# Patient Record
Sex: Female | Born: 1995 | Race: Black or African American | Hispanic: No | Marital: Single | Smoking: Current every day smoker
Health system: Southern US, Community
[De-identification: ages and names within clinical notes are randomized; demographics above are authoritative.]

## PROBLEM LIST (undated history)

## (undated) DIAGNOSIS — J45909 Unspecified asthma, uncomplicated: Secondary | ICD-10-CM

## (undated) DIAGNOSIS — F32A Depression, unspecified: Secondary | ICD-10-CM

## (undated) DIAGNOSIS — M1990 Unspecified osteoarthritis, unspecified site: Secondary | ICD-10-CM

## (undated) DIAGNOSIS — I10 Essential (primary) hypertension: Secondary | ICD-10-CM

## (undated) DIAGNOSIS — F419 Anxiety disorder, unspecified: Secondary | ICD-10-CM

## (undated) DIAGNOSIS — R519 Headache, unspecified: Secondary | ICD-10-CM

---
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
## 2019-12-25 ENCOUNTER — Emergency Department (HOSPITAL_COMMUNITY)
Admission: EM | Admit: 2019-12-25 | Discharge: 2019-12-26 | Disposition: A | Discharge status: Home / Self Care | Payer: Self-pay | Attending: Emergency Medicine | Admitting: Emergency Medicine

## 2019-12-25 ENCOUNTER — Encounter (HOSPITAL_COMMUNITY): Payer: Self-pay

## 2019-12-25 DIAGNOSIS — L509 Urticaria, unspecified: Secondary | ICD-10-CM | POA: Insufficient documentation

## 2019-12-25 NOTE — ED Triage Notes (Signed)
Pt arrives to ED w/ c/o generalized rash x 3 days. Pt has been using benadryl which she states has not helped.

## 2019-12-26 MED ORDER — FAMOTIDINE 20 MG PO TABS: 20.0000 mg | ORAL_TABLET | Freq: Every day | ORAL | 0 refills | Status: DC

## 2019-12-26 MED ORDER — EPINEPHRINE 0.3 MG/0.3ML IJ SOAJ: 0.3000 mg | INTRAMUSCULAR | 0 refills | Status: AC | PRN

## 2019-12-26 MED ORDER — FAMOTIDINE 20 MG PO TABS
40.0000 mg | ORAL_TABLET | Freq: Once | ORAL | Status: AC
  Administered 2019-12-26: 40 mg via ORAL
  Filled 2019-12-26: qty 2

## 2019-12-26 NOTE — ED Provider Notes (Signed)
[facility] Provider Note  CSN: [id number] Arrival date & time: 12/25/19 2213  Chief Complaint(s) Rash  HPI Mary Molina is a 24 y.o. female   The history is provided by the patient.  Rash Quality: itchiness and redness   Severity:  Moderate Onset quality:  Gradual Duration:  3 days Timing:  Constant Progression since onset: migrating. Chronicity:  New Context: medications (started Flagyl last week)   Relieved by:  Antihistamines Worsened by:  Nothing Associated symptoms: no fever, no headaches, no nausea, no throat swelling, no tongue swelling, not vomiting and not wheezing    Patient reports that the rash began in her lower extremities then moved to her breast and now to her bilateral upper extremities.  The rash migrates every day.  It has disappeared from the legs and the breast after taking Benadryl.  Urticaria in bilateral upper extremities has improved since taking her urticaria but has not gone away.  Past Medical History History reviewed. No pertinent past medical history. There are no problems to display for this patient.  Home Medication(s) Prior to Admission medications   Medication Sig Start Date End Date Taking? Authorizing Provider  EPINEPHrine 0.3 mg/0.3 mL IJ SOAJ injection Inject 0.3 mLs (0.3 mg total) into the muscle as needed for anaphylaxis. 12/26/19   Nira Conn, MD  famotidine (PEPCID) 20 MG tablet Take 1 tablet (20 mg total) by mouth daily for 5 days. 12/26/19 12/31/19  Nira Conn, MD                                                                                                                                    Past Surgical History History reviewed. No pertinent surgical history. Family History No family history on file.  Social History Social History   Tobacco Use  . Smoking status: Not on file  Substance Use Topics  . Alcohol use: Not on file  . Drug use: Not on file    Allergies Patient has no allergy information on record.  Review of Systems Review of Systems  Constitutional: Negative for fever.  Respiratory: Negative for wheezing.   Gastrointestinal: Negative for nausea and vomiting.  Skin: Positive for rash.  Neurological: Negative for headaches.   All other systems are reviewed and are negative for acute change except as noted in the HPI  Physical Exam Vital Signs  I have reviewed the triage vital signs BP (!) 130/84 (BP Location: Left Arm)   Pulse 105   Temp 98.4 F (36.9 C) (Oral)   Resp 18   SpO2 96%   Physical Exam Vitals reviewed.  Constitutional:      General: She is not in acute distress.    Appearance: She is well-developed. She is not diaphoretic.  HENT:     Head: Normocephalic and atraumatic.     Nose: Nose normal.  Eyes:     General: No scleral icterus.  Right eye: No discharge.        Left eye: No discharge.     Conjunctiva/sclera: Conjunctivae normal.     Pupils: Pupils are equal, round, and reactive to light.  Cardiovascular:     Rate and Rhythm: Normal rate and regular rhythm.     Heart sounds: No murmur heard.  No friction rub. No gallop.   Pulmonary:     Effort: Pulmonary effort is normal. No respiratory distress.     Breath sounds: Normal breath sounds. No stridor. No rales.  Abdominal:     General: There is no distension.     Palpations: Abdomen is soft.     Tenderness: There is no abdominal tenderness.  Musculoskeletal:        General: No tenderness.     Cervical back: Normal range of motion and neck supple.  Skin:    General: Skin is warm and dry.     Findings: Rash present. No erythema. Rash is urticarial (to BUE).  Neurological:     Mental Status: She is alert and oriented to person, place, and time.     ED Results and Treatments Labs (all labs ordered are listed, but only abnormal results are displayed) Labs Reviewed - No data to display                                                                                                                        EKG  EKG Interpretation  Date/Time:    Ventricular Rate:    PR Interval:    QRS Duration:   QT Interval:    QTC Calculation:   R Axis:     Text Interpretation:        Radiology No results found.  Pertinent labs & imaging results that were available during my care of the patient were reviewed by me and considered in my medical decision making (see chart for details).  Medications Ordered in ED Medications  famotidine (PEPCID) tablet 40 mg (has no administration in time range)                                                                                                                                    Procedures Procedures  (including critical care time)  Medical Decision Making / ED Course I have reviewed the nursing notes for this encounter and the patient's prior records (if available in EHR or on provided  paperwork).   [facility] was evaluated in Emergency Department on 12/26/2019 for the symptoms described in the history of present illness. She was evaluated in the context of the global COVID-19 pandemic, which necessitated consideration that the patient might be at risk for infection with the SARS-CoV-2 virus that causes COVID-19. Institutional protocols and algorithms that pertain to the evaluation of patients at risk for COVID-19 are in a state of rapid change based on information released by regulatory bodies including the CDC and federal and state organizations. These policies and algorithms were followed during the patient's care in the ED.  AFVSS. Urticaria to BUE No respiratory, GI, or neurologic symptoms to suggest anaphylaxis. No recent infectious symptoms suggestive of viral urticaria.  Patient has taken benadryl prior to arrival.   On exam, there is no evidence of oral swelling or airway compromise.   Given H2 blocker.   Safe for discharge with strict return precautions. Given Rx for H2  blocker and epi pen.      Final Clinical Impression(s) / ED Diagnoses Final diagnoses:  Hives   The patient appears reasonably screened and/or stabilized for discharge and I doubt any other medical condition or other [facility] requiring further screening, evaluation, or treatment in the ED at this time prior to discharge. Safe for discharge with strict return precautions.  Disposition: Discharge  Condition: Good  I have discussed the results, Dx and Tx plan with the patient/family who expressed understanding and agree(s) with the plan. Discharge instructions discussed at length. The patient/family was given strict return precautions who verbalized understanding of the instructions. No further questions at time of discharge.    ED Discharge Orders         Ordered    famotidine (PEPCID) 20 MG tablet  Daily     Discontinue  Reprint     12/26/19 0116    EPINEPHrine 0.3 mg/0.3 mL IJ SOAJ injection  As needed     Discontinue  Reprint     12/26/19 0116            Follow Up: Primary care provider  Schedule an appointment as soon as possible for a visit        This chart was dictated using voice recognition software.  Despite best efforts to proofread,  errors can occur which can change the documentation meaning.   Nira Conn, MD 12/26/19 [phone]

## 2019-12-26 NOTE — ED Notes (Signed)
All discharge instructions and medications discussed with pt. Pt verbalizes understanding. Discharged without issue.

## 2020-02-18 ENCOUNTER — Emergency Department (HOSPITAL_COMMUNITY)
Admission: EM | Admit: 2020-02-18 | Discharge: 2020-02-19 | Disposition: A | Discharge status: Home / Self Care | Payer: Medicaid Other | Attending: Emergency Medicine | Admitting: Emergency Medicine

## 2020-02-18 ENCOUNTER — Encounter (HOSPITAL_COMMUNITY): Payer: Self-pay

## 2020-02-18 DIAGNOSIS — Z5321 Procedure and treatment not carried out due to patient leaving prior to being seen by health care provider: Secondary | ICD-10-CM | POA: Insufficient documentation

## 2020-02-18 DIAGNOSIS — R197 Diarrhea, unspecified: Secondary | ICD-10-CM | POA: Insufficient documentation

## 2020-02-18 DIAGNOSIS — J45909 Unspecified asthma, uncomplicated: Secondary | ICD-10-CM | POA: Insufficient documentation

## 2020-02-18 DIAGNOSIS — R21 Rash and other nonspecific skin eruption: Secondary | ICD-10-CM | POA: Insufficient documentation

## 2020-02-18 HISTORY — DX: Essential (primary) hypertension: I10

## 2020-02-18 HISTORY — DX: Unspecified asthma, uncomplicated: J45.909

## 2020-02-18 LAB — CBC
HCT: 40.6 % (ref 36.0–46.0)
Hemoglobin: 13.1 g/dL (ref 12.0–15.0)
MCH: 29.1 pg (ref 26.0–34.0)
MCHC: 32.3 g/dL (ref 30.0–36.0)
MCV: 90.2 fL (ref 80.0–100.0)
Platelets: 289 10*3/uL (ref 150–400)
RBC: 4.5 MIL/uL (ref 3.87–5.11)
RDW: 12.6 % (ref 11.5–15.5)
WBC: 10 10*3/uL (ref 4.0–10.5)
nRBC: 0 % (ref 0.0–0.2)

## 2020-02-18 LAB — I-STAT BETA HCG BLOOD, ED (MC, WL, AP ONLY): I-stat hCG, quantitative: <5 m[IU]/mL (ref <5)

## 2020-02-18 LAB — COMPREHENSIVE METABOLIC PANEL
ALT: 15 U/L (ref 0–44)
AST: 17 U/L (ref 15–41)
Albumin: 4 g/dL (ref 3.5–5.0)
Alkaline Phosphatase: 65 U/L (ref 38–126)
Anion gap: 9 (ref 5–15)
BUN: 8 mg/dL (ref 6–20)
CO2: 25 mmol/L (ref 22–32)
Calcium: 9.2 mg/dL (ref 8.9–10.3)
Chloride: 104 mmol/L (ref 98–111)
Creatinine, Ser: 0.92 mg/dL (ref 0.44–1.00)
GFR calc Af Amer: >60 mL/min (ref >60)
GFR calc non Af Amer: >60 mL/min (ref >60)
Glucose, Bld: 121 mg/dL — ABNORMAL HIGH (ref 70–99)
Potassium: 3.2 mmol/L — ABNORMAL LOW (ref 3.5–5.1)
Sodium: 138 mmol/L (ref 135–145)
Total Bilirubin: 0.4 mg/dL (ref 0.3–1.2)
Total Protein: 7 g/dL (ref 6.5–8.1)

## 2020-02-18 LAB — LIPASE, BLOOD: Lipase: 31 U/L (ref 11–51)

## 2020-02-18 NOTE — ED Triage Notes (Signed)
Pt reports that she broke out in hives the other day and again today as well as diarrhea for the past 3 days

## 2020-02-19 LAB — URINALYSIS, ROUTINE W REFLEX MICROSCOPIC
Bilirubin Urine: NEGATIVE
Glucose, UA: NEGATIVE mg/dL
Hgb urine dipstick: NEGATIVE
Ketones, ur: NEGATIVE mg/dL
Leukocytes,Ua: NEGATIVE
Nitrite: NEGATIVE
Protein, ur: 30 mg/dL — AB
Specific Gravity, Urine: 1.032 — ABNORMAL HIGH (ref 1.005–1.030)
pH: 6 (ref 5.0–8.0)

## 2020-02-19 NOTE — ED Notes (Signed)
Pt stated she is leaving. 

## 2020-04-11 ENCOUNTER — Emergency Department (HOSPITAL_COMMUNITY): Payer: HRSA Program

## 2020-04-11 ENCOUNTER — Encounter (HOSPITAL_COMMUNITY): Payer: Self-pay | Admitting: Emergency Medicine

## 2020-04-11 ENCOUNTER — Other Ambulatory Visit: Payer: Self-pay

## 2020-04-11 ENCOUNTER — Emergency Department (HOSPITAL_COMMUNITY)
Admission: EM | Admit: 2020-04-11 | Discharge: 2020-04-11 | Disposition: A | Discharge status: Home / Self Care | Payer: HRSA Program | Attending: Emergency Medicine | Admitting: Emergency Medicine

## 2020-04-11 DIAGNOSIS — B349 Viral infection, unspecified: Secondary | ICD-10-CM | POA: Diagnosis not present

## 2020-04-11 DIAGNOSIS — U071 COVID-19: Secondary | ICD-10-CM | POA: Diagnosis not present

## 2020-04-11 DIAGNOSIS — J45909 Unspecified asthma, uncomplicated: Secondary | ICD-10-CM | POA: Insufficient documentation

## 2020-04-11 DIAGNOSIS — I10 Essential (primary) hypertension: Secondary | ICD-10-CM | POA: Insufficient documentation

## 2020-04-11 DIAGNOSIS — R059 Cough, unspecified: Secondary | ICD-10-CM | POA: Diagnosis present

## 2020-04-11 LAB — CBC
HCT: 42.8 % (ref 36.0–46.0)
Hemoglobin: 13.8 g/dL (ref 12.0–15.0)
MCH: 29.5 pg (ref 26.0–34.0)
MCHC: 32.2 g/dL (ref 30.0–36.0)
MCV: 91.5 fL (ref 80.0–100.0)
Platelets: 237 10*3/uL (ref 150–400)
RBC: 4.68 MIL/uL (ref 3.87–5.11)
RDW: 12.3 % (ref 11.5–15.5)
WBC: 7.1 10*3/uL (ref 4.0–10.5)
nRBC: 0 % (ref 0.0–0.2)

## 2020-04-11 LAB — TROPONIN I (HIGH SENSITIVITY)
Troponin I (High Sensitivity): <2 ng/L (ref <18)
Troponin I (High Sensitivity): <2 ng/L (ref <18)

## 2020-04-11 LAB — RESPIRATORY PANEL BY RT PCR (FLU A&B, COVID)
Influenza A by PCR: NEGATIVE
Influenza B by PCR: NEGATIVE
SARS Coronavirus 2 by RT PCR: POSITIVE — AB

## 2020-04-11 LAB — URINALYSIS, ROUTINE W REFLEX MICROSCOPIC
Bilirubin Urine: NEGATIVE
Glucose, UA: NEGATIVE mg/dL
Ketones, ur: NEGATIVE mg/dL
Nitrite: NEGATIVE
Protein, ur: NEGATIVE mg/dL
Specific Gravity, Urine: 1.012 (ref 1.005–1.030)
pH: 6 (ref 5.0–8.0)

## 2020-04-11 LAB — BASIC METABOLIC PANEL
Anion gap: 10 (ref 5–15)
BUN: 5 mg/dL — ABNORMAL LOW (ref 6–20)
CO2: 20 mmol/L — ABNORMAL LOW (ref 22–32)
Calcium: 9.1 mg/dL (ref 8.9–10.3)
Chloride: 107 mmol/L (ref 98–111)
Creatinine, Ser: 1.04 mg/dL — ABNORMAL HIGH (ref 0.44–1.00)
GFR, Estimated: >60 mL/min (ref >60)
Glucose, Bld: 118 mg/dL — ABNORMAL HIGH (ref 70–99)
Potassium: 4 mmol/L (ref 3.5–5.1)
Sodium: 137 mmol/L (ref 135–145)

## 2020-04-11 LAB — I-STAT BETA HCG BLOOD, ED (MC, WL, AP ONLY): I-stat hCG, quantitative: <5 m[IU]/mL (ref <5)

## 2020-04-11 MED ORDER — ONDANSETRON 4 MG PO TBDP
4.0000 mg | ORAL_TABLET | Freq: Three times a day (TID) | ORAL | 0 refills | Status: DC | PRN
Start: 2020-04-11 — End: 2021-02-15

## 2020-04-11 MED ORDER — KETOROLAC TROMETHAMINE 30 MG/ML IJ SOLN
15.0000 mg | Freq: Once | INTRAMUSCULAR | Status: AC
  Administered 2020-04-11: 15 mg via INTRAVENOUS
  Filled 2020-04-11: qty 1

## 2020-04-11 MED ORDER — BENZONATATE 100 MG PO CAPS
100.0000 mg | ORAL_CAPSULE | Freq: Once | ORAL | Status: AC
  Administered 2020-04-11: 100 mg via ORAL
  Filled 2020-04-11: qty 1

## 2020-04-11 MED ORDER — BENZONATATE 100 MG PO CAPS
100.0000 mg | ORAL_CAPSULE | Freq: Three times a day (TID) | ORAL | 0 refills | Status: DC
Start: 2020-04-11 — End: 2021-07-06

## 2020-04-11 MED ORDER — ACETAMINOPHEN 500 MG PO TABS
1000.0000 mg | ORAL_TABLET | Freq: Once | ORAL | Status: DC
  Filled 2020-04-11: qty 2

## 2020-04-11 MED ORDER — SODIUM CHLORIDE 0.9 % IV BOLUS
1000.0000 mL | Freq: Once | INTRAVENOUS | Status: AC
  Administered 2020-04-11: 1000 mL via INTRAVENOUS

## 2020-04-11 NOTE — Discharge Instructions (Signed)
Take tylenol 2 pills 4 times a day and motrin 4 pills 3 times a day.  Drink plenty of fluids.  Return for worsening shortness of breath, headache, confusion. Follow up with your family doctor.   

## 2020-04-11 NOTE — ED Triage Notes (Signed)
Pt coming from home. Complaint of chest pain, abdominal pain and shortness of breath since Monday. Pt seen at [facility] and negative for covid and flu. NAD.

## 2020-04-11 NOTE — ED Provider Notes (Signed)
[facility] EMERGENCY DEPARTMENT Provider Note   CSN: [id number] Arrival date & time: 04/11/20  1637     History Chief Complaint  Patient presents with  . Abdominal Pain  . Chest Pain  . Shortness of Breath    Mary Molina is a 24 y.o. female.  24 yo F with a chief complaints of cough congestion fevers chills myalgias sore throat abdominal pain and chest pain.  Going on for about a week now.  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  Some were diagnosed with the flu and some are diagnosed with strep pharyngitis.  Yesterday she went to an urgent care center and had a Covid strep and flu test that were negative.  She was still having symptoms today and so came to the ED for evaluation.  The abdominal pain is diffuse and seems to come and go.  Chest pain is substernal and is worse with coughing and deep breathing twisting turning.  The history is provided by the patient.  Abdominal Pain Associated symptoms: chest pain, cough, shortness of breath and sore throat   Associated symptoms: no chills, no dysuria, no fever, no nausea and no vomiting   Chest Pain Associated symptoms: abdominal pain, cough and shortness of breath   Associated symptoms: no dizziness, no fever, no headache, no nausea, no palpitations and no vomiting   Shortness of Breath Associated symptoms: abdominal pain, chest pain, cough and sore throat   Associated symptoms: no fever, no headaches, no vomiting and no wheezing   Illness Severity:  Moderate Onset quality:  Gradual Duration:  1 week Timing:  Constant Progression:  Worsening Chronicity:  New Associated symptoms: abdominal pain, chest pain, congestion, cough, shortness of breath and sore throat   Associated symptoms: no fever, no headaches, no myalgias, no nausea, no rhinorrhea, no vomiting and no wheezing        Past Medical History:  Diagnosis Date  . Asthma   . Hypertension     There  are no problems to display for this patient.   History reviewed. No pertinent surgical history.   OB History   No obstetric history on file.     No family history on file.  Social History   Tobacco Use  . Smoking status: Not on file  Substance Use Topics  . Alcohol use: Not on file  . Drug use: Not on file    Home Medications Prior to Admission medications   Medication Sig Start Date End Date Taking? Authorizing Provider  albuterol (PROVENTIL) (2.5 MG/3ML) 0.083% nebulizer solution Take 2.5 mg by nebulization daily as needed.  11/10/19  Yes [provider]  baclofen (LIORESAL) 10 MG tablet Take 10 mg by mouth 3 (three) times daily. 12/18/19  Yes [provider]  EPINEPHrine 0.3 mg/0.3 mL IJ SOAJ injection Inject 0.3 mLs (0.3 mg total) into the muscle as needed for anaphylaxis. 12/26/19  Yes Cardama, Amadeo Garnet, MD  benzonatate (TESSALON) 100 MG capsule Take 1 capsule (100 mg total) by mouth every 8 (eight) hours. 04/11/20   Melene Plan, DO  famotidine (PEPCID) 20 MG tablet Take 1 tablet (20 mg total) by mouth daily for 5 days. Patient not taking: Reported on 04/11/2020 12/26/19 12/31/19  Nira Conn, MD  ondansetron (ZOFRAN ODT) 4 MG disintegrating tablet Take 1 tablet (4 mg total) by mouth every 8 (eight) hours as needed for nausea or vomiting. 04/11/20   Melene Plan, DO  Allergies    Banana, Pineapple, Metronidazole, and Tylenol [acetaminophen]  Review of Systems   Review of Systems  Constitutional: Negative for chills and fever.  HENT: Positive for congestion and sore throat. Negative for rhinorrhea.   Eyes: Negative for redness and visual disturbance.  Respiratory: Positive for cough and shortness of breath. Negative for wheezing.   Cardiovascular: Positive for chest pain. Negative for palpitations.  Gastrointestinal: Positive for abdominal pain. Negative for nausea and vomiting.  Genitourinary: Negative for dysuria and urgency.    Musculoskeletal: Negative for arthralgias and myalgias.  Skin: Negative for pallor and wound.  Neurological: Negative for dizziness and headaches.    Physical Exam Updated Vital Signs BP 124/72   Pulse 84   Temp 99.9 F (37.7 C) (Oral)   Resp (!) 25   Ht 5\' 6"  (1.676 m)   Wt 88.5 kg   SpO2 100%   BMI 31.47 kg/m   Physical Exam Vitals and nursing note reviewed.  Constitutional:      General: She is not in acute distress.    Appearance: She is well-developed. She is not diaphoretic.  HENT:     Head: Normocephalic and atraumatic.  Eyes:     Pupils: Pupils are equal, round, and reactive to light.  Cardiovascular:     Rate and Rhythm: Normal rate and regular rhythm.     Heart sounds: No murmur heard.  No friction rub. No gallop.   Pulmonary:     Effort: Pulmonary effort is normal.     Breath sounds: No wheezing or rales.  Abdominal:     General: There is no distension.     Palpations: Abdomen is soft.     Tenderness: There is abdominal tenderness (mild diffuse).  Musculoskeletal:        General: No tenderness.     Cervical back: Normal range of motion and neck supple.  Skin:    General: Skin is warm and dry.  Neurological:     Mental Status: She is alert and oriented to person, place, and time.  Psychiatric:        Behavior: Behavior normal.     ED Results / Procedures / Treatments   Labs (all labs ordered are listed, but only abnormal results are displayed) Labs Reviewed  BASIC METABOLIC PANEL - Abnormal; Notable for the following components:      Result Value   CO2 20 (*)    Glucose, Bld 118 (*)    BUN 5 (*)    Creatinine, Ser 1.04 (*)    All other components within normal limits  URINALYSIS, ROUTINE W REFLEX MICROSCOPIC - Abnormal; Notable for the following components:   APPearance HAZY (*)    Hgb urine dipstick SMALL (*)    Leukocytes,Ua MODERATE (*)    Bacteria, UA RARE (*)    All other components within normal limits  RESPIRATORY PANEL BY RT PCR  (FLU A&B, COVID)  CBC  I-STAT BETA HCG BLOOD, ED ([facility], [facility], [facility] ONLY)  TROPONIN I (HIGH SENSITIVITY)  TROPONIN I (HIGH SENSITIVITY)    EKG EKG Interpretation  Date/Time:  Sunday April 11 2020 16:41:48 EST Ventricular Rate:  128 PR Interval:  116 QRS Duration: 72 QT Interval:  310 QTC Calculation: 452 R Axis:   85 Text Interpretation: Sinus tachycardia Otherwise normal ECG No old tracing to compare Confirmed by 09-29[birth year]1989 [phone]) on 04/11/2020 5:40:22 PM   Radiology DG Chest 2 View  Result Date: 04/11/2020 CLINICAL DATA:  Chest pain EXAM: CHEST - 2  VIEW COMPARISON:  None. FINDINGS: The heart size and mediastinal contours are within normal limits. Both lungs are clear. The visualized skeletal structures are unremarkable. IMPRESSION: No active cardiopulmonary disease. Electronically Signed   By: Jonna Clark M.D.   On: 04/11/2020 18:35    Procedures Procedures (including critical care time)  Medications Ordered in ED Medications  acetaminophen (TYLENOL) tablet 1,000 mg (1,000 mg Oral Refused 04/11/20 1755)  sodium chloride 0.9 % bolus 1,000 mL (0 mLs Intravenous Stopped 04/11/20 1855)  benzonatate (TESSALON) capsule 100 mg (100 mg Oral Given 04/11/20 1754)  ketorolac (TORADOL) 30 MG/ML injection 15 mg (15 mg Intravenous Given 04/11/20 1755)    ED Course  I have reviewed the triage vital signs and the nursing notes.  Pertinent labs & imaging results that were available during my care of the patient were reviewed by me and considered in my medical decision making (see chart for details).    MDM Rules/Calculators/A&P                          24 yo F with a viral syndrome, going on for past week.  Well appearing.  Labwork obtained in triage, will give fluids, check labs, reassess.   UA without infection. No significant electrolyte abnormality.  CBC without anemia.   CXR viewed by me without focal infiltrate.  D/c home.   7:47 PM:  I have discussed the diagnosis/risks/treatment  options with the patient and believe the pt to be eligible for discharge home to follow-up with PCP. We also discussed returning to the ED immediately if new or worsening sx occur. We discussed the sx which are most concerning (e.g., sudden worsening pain, fever, inability to tolerate by mouth) that necessitate immediate return. Medications administered to the patient during their visit and any new prescriptions provided to the patient are listed below.  Medications given during this visit Medications  acetaminophen (TYLENOL) tablet 1,000 mg (1,000 mg Oral Refused 04/11/20 1755)  sodium chloride 0.9 % bolus 1,000 mL (0 mLs Intravenous Stopped 04/11/20 1855)  benzonatate (TESSALON) capsule 100 mg (100 mg Oral Given 04/11/20 1754)  ketorolac (TORADOL) 30 MG/ML injection 15 mg (15 mg Intravenous Given 04/11/20 1755)     The patient appears reasonably screen and/or stabilized for discharge and I doubt any other medical condition or other [facility] requiring further screening, evaluation, or treatment in the ED at this time prior to discharge.   Final Clinical Impression(s) / ED Diagnoses Final diagnoses:  Viral syndrome    Rx / DC Orders ED Discharge Orders         Ordered    ondansetron (ZOFRAN ODT) 4 MG disintegrating tablet  Every 8 hours PRN        04/11/20 1934    benzonatate (TESSALON) 100 MG capsule  Every 8 hours        04/11/20 1934           Melene Plan, DO 04/11/20 1947

## 2020-04-12 ENCOUNTER — Ambulatory Visit (HOSPITAL_COMMUNITY)
Admission: RE | Admit: 2020-04-12 | Discharge: 2020-04-12 | Disposition: A | Discharge status: Home / Self Care | Payer: Medicaid Other | Source: Ambulatory Visit | Attending: Pulmonary Disease | Admitting: Pulmonary Disease

## 2020-04-12 ENCOUNTER — Other Ambulatory Visit: Payer: Self-pay | Admitting: Nurse Practitioner

## 2020-04-12 DIAGNOSIS — U071 COVID-19: Secondary | ICD-10-CM

## 2020-04-12 MED ORDER — ALBUTEROL SULFATE HFA 108 (90 BASE) MCG/ACT IN AERS: 2.0000 | INHALATION_SPRAY | Freq: Once | RESPIRATORY_TRACT | Status: DC | PRN

## 2020-04-12 MED ORDER — DIPHENHYDRAMINE HCL 50 MG/ML IJ SOLN: 50.0000 mg | Freq: Once | INTRAMUSCULAR | Status: DC | PRN

## 2020-04-12 MED ORDER — EPINEPHRINE 0.3 MG/0.3ML IJ SOAJ: 0.3000 mg | Freq: Once | INTRAMUSCULAR | Status: DC | PRN

## 2020-04-12 MED ORDER — FAMOTIDINE IN NACL 20-0.9 MG/50ML-% IV SOLN: 20.0000 mg | Freq: Once | INTRAVENOUS | Status: DC | PRN

## 2020-04-12 MED ORDER — SODIUM CHLORIDE 0.9 % IV SOLN: INTRAVENOUS | Status: DC | PRN

## 2020-04-12 MED ORDER — SOTROVIMAB 500 MG/8ML IV SOLN
500.0000 mg | Freq: Once | INTRAVENOUS | Status: AC
  Administered 2020-04-12: 500 mg via INTRAVENOUS

## 2020-04-12 MED ORDER — METHYLPREDNISOLONE SODIUM SUCC 125 MG IJ SOLR: 125.0000 mg | Freq: Once | INTRAMUSCULAR | Status: DC | PRN

## 2020-04-12 NOTE — Discharge Instructions (Signed)

## 2020-04-12 NOTE — Progress Notes (Signed)
I connected by phone with [facility] on 04/12/2020 at 10:28 AM to discuss the potential use of a new treatment for mild to moderate COVID-19 viral infection in non-hospitalized patients.  This patient is a 24 y.o. female that meets the FDA criteria for Emergency Use Authorization of COVID monoclonal antibody casirivimab/imdevimab, bamlanivimab/eteseviamb, or sotrovimab.  Has a (+) direct SARS-CoV-2 viral test result  Has mild or moderate COVID-19   Is NOT hospitalized due to COVID-19  Is within 10 days of symptom onset  Has at least one of the high risk factor(s) for progression to severe COVID-19 and/or hospitalization as defined in EUA.  Specific high risk criteria : BMI > 25   I have spoken and communicated the following to the patient or parent/caregiver regarding COVID monoclonal antibody treatment:  1. FDA has authorized the emergency use for the treatment of mild to moderate COVID-19 in adults and pediatric patients with positive results of direct SARS-CoV-2 viral testing who are 70 years of age and older weighing at least 40 kg, and who are at high risk for progressing to severe COVID-19 and/or hospitalization.  2. The significant known and potential risks and benefits of COVID monoclonal antibody, and the extent to which such potential risks and benefits are unknown.  3. Information on available alternative treatments and the risks and benefits of those alternatives, including clinical trials.  4. Patients treated with COVID monoclonal antibody should continue to self-isolate and use infection control measures (e.g., wear mask, isolate, social distance, avoid sharing personal items, clean and disinfect "high touch" surfaces, and frequent handwashing) according to CDC guidelines.   5. The patient or parent/caregiver has the option to accept or refuse COVID monoclonal antibody treatment.  After reviewing this information with the patient, the patient has agreed to receive one of  the available covid 19 monoclonal antibodies and will be provided an appropriate fact sheet prior to infusion. [name], NP 04/12/2020 10:28 AM

## 2020-04-12 NOTE — Progress Notes (Signed)
Diagnosis: COVID-19  Physician: Dr. Delford Field  Procedure: Covid Infusion Clinic Med: Sotrovimab-Provided patient with Sotrovimab fact sheet for patients, parents and caregivers prior to infusion.  Complications: No immediate complications noted.  Discharge: Discharged home   [facility] 04/12/2020

## 2020-06-24 ENCOUNTER — Other Ambulatory Visit: Payer: Self-pay

## 2020-06-24 ENCOUNTER — Emergency Department (HOSPITAL_COMMUNITY)
Admission: EM | Admit: 2020-06-24 | Discharge: 2020-06-24 | Disposition: A | Discharge status: Home / Self Care | Payer: Self-pay | Attending: Emergency Medicine | Admitting: Emergency Medicine

## 2020-06-24 ENCOUNTER — Emergency Department (HOSPITAL_COMMUNITY): Payer: Self-pay

## 2020-06-24 DIAGNOSIS — I10 Essential (primary) hypertension: Secondary | ICD-10-CM | POA: Insufficient documentation

## 2020-06-24 DIAGNOSIS — R0602 Shortness of breath: Secondary | ICD-10-CM | POA: Insufficient documentation

## 2020-06-24 DIAGNOSIS — T7840XA Allergy, unspecified, initial encounter: Secondary | ICD-10-CM | POA: Insufficient documentation

## 2020-06-24 DIAGNOSIS — J45909 Unspecified asthma, uncomplicated: Secondary | ICD-10-CM | POA: Insufficient documentation

## 2020-06-24 DIAGNOSIS — R0789 Other chest pain: Secondary | ICD-10-CM | POA: Insufficient documentation

## 2020-06-24 DIAGNOSIS — Z8616 Personal history of COVID-19: Secondary | ICD-10-CM | POA: Insufficient documentation

## 2020-06-24 DIAGNOSIS — R110 Nausea: Secondary | ICD-10-CM | POA: Insufficient documentation

## 2020-06-24 LAB — BASIC METABOLIC PANEL
Anion gap: 11 (ref 5–15)
BUN: 7 mg/dL (ref 6–20)
CO2: 21 mmol/L — ABNORMAL LOW (ref 22–32)
Calcium: 9.1 mg/dL (ref 8.9–10.3)
Chloride: 105 mmol/L (ref 98–111)
Creatinine, Ser: 0.76 mg/dL (ref 0.44–1.00)
GFR, Estimated: >60 mL/min (ref >60)
Glucose, Bld: 155 mg/dL — ABNORMAL HIGH (ref 70–99)
Potassium: 3.7 mmol/L (ref 3.5–5.1)
Sodium: 137 mmol/L (ref 135–145)

## 2020-06-24 LAB — CBC
HCT: 44.2 % (ref 36.0–46.0)
Hemoglobin: 15.1 g/dL — ABNORMAL HIGH (ref 12.0–15.0)
MCH: 30 pg (ref 26.0–34.0)
MCHC: 34.2 g/dL (ref 30.0–36.0)
MCV: 87.7 fL (ref 80.0–100.0)
Platelets: 322 10*3/uL (ref 150–400)
RBC: 5.04 MIL/uL (ref 3.87–5.11)
RDW: 12.9 % (ref 11.5–15.5)
WBC: 11.6 10*3/uL — ABNORMAL HIGH (ref 4.0–10.5)
nRBC: 0 % (ref 0.0–0.2)

## 2020-06-24 LAB — TROPONIN I (HIGH SENSITIVITY)
Troponin I (High Sensitivity): 5 ng/L (ref <18)
Troponin I (High Sensitivity): 3 ng/L (ref <18)

## 2020-06-24 LAB — I-STAT BETA HCG BLOOD, ED (MC, WL, AP ONLY): I-stat hCG, quantitative: <5 m[IU]/mL (ref <5)

## 2020-06-24 MED ORDER — DIPHENHYDRAMINE HCL 25 MG PO CAPS
50.0000 mg | ORAL_CAPSULE | Freq: Once | ORAL | Status: AC
  Administered 2020-06-24: 50 mg via ORAL
  Filled 2020-06-24: qty 2

## 2020-06-24 MED ORDER — FAMOTIDINE 20 MG PO TABS
40.0000 mg | ORAL_TABLET | Freq: Once | ORAL | Status: AC
  Administered 2020-06-24: 40 mg via ORAL
  Filled 2020-06-24: qty 2

## 2020-06-24 MED ORDER — FAMOTIDINE 20 MG PO TABS: 20.0000 mg | ORAL_TABLET | Freq: Two times a day (BID) | ORAL | 0 refills | Status: DC

## 2020-06-24 MED ORDER — PREDNISONE 10 MG (21) PO TBPK: ORAL_TABLET | Freq: Every day | ORAL | 0 refills | Status: DC

## 2020-06-24 MED ORDER — PREDNISONE 20 MG PO TABS
60.0000 mg | ORAL_TABLET | Freq: Once | ORAL | Status: AC
  Administered 2020-06-24: 60 mg via ORAL
  Filled 2020-06-24: qty 3

## 2020-06-24 MED ORDER — DIPHENHYDRAMINE HCL 25 MG PO TABS: 25.0000 mg | ORAL_TABLET | Freq: Four times a day (QID) | ORAL | 0 refills | Status: DC | PRN

## 2020-06-24 NOTE — ED Notes (Signed)
Received pt from lobby at this time. Ambulatory to bathroom with steady gait. Not in respiratory distress.

## 2020-06-24 NOTE — ED Provider Notes (Signed)
[facility]   CSN: [id number] Arrival date & time: 06/24/20  1202     History Chief Complaint  Patient presents with  . Chest Pain  . Allergic Reaction    Mary Molina is a 25 y.o. female.  HPI Patient is a 25 year old female with past medical history significant for asthma hypertension.  She is presented today with migratory hives, itching with redness.  She states that this episode began 2 days ago and has been consistent since.  She states that she has also had intermittent episodes of nausea and upset stomach.  She states that she has not had any diarrhea she has not been wheezing but she feels short of breath.  She states she had similar episode 7/23 was seen in the emergency department and given Pepcid and discharged she states that her symptoms did improve she was given an EpiPen at discharge which she has never used.  She had a another episode 11/7 at that time was found to be COVID-positive.  She does work in a daycare facility.  She states that she has not had any fevers, chills, cough congestion or myalgias.  She states that she is having symptoms more consistent with her initial episode.  She denies any recent travel no antibiotics, she denies any new animals or detergents.  She states that these itchy rashes are present on her chest, abdomen, arms.  She also states that she feels her acne is getting worse.  No other associate symptoms.  No aggravating factors.  She has taken no medications prior to arrival other than 1 dose of Benadryl which she states did not work.  She does not recall if she took 1 or 2 tablets.      Past Medical History:  Diagnosis Date  . Asthma   . Hypertension     There are no problems to display for this patient.   No past surgical history on file.   OB History   No obstetric history on file.     No family history on file.     Home Medications Prior to Admission medications    Medication Sig Start Date End Date Taking? Authorizing Provider  diphenhydrAMINE (BENADRYL) 25 MG tablet Take 1 tablet (25 mg total) by mouth every 6 (six) hours as needed. 06/24/20  Yes Mary Molina  famotidine (PEPCID) 20 MG tablet Take 1 tablet (20 mg total) by mouth 2 (two) times daily. 06/24/20  Yes Mary Laube Molina, Molina  predniSONE (STERAPRED UNI-PAK 21 TAB) 10 MG (21) TBPK tablet Take by mouth daily. Take 6 tabs by mouth daily  for 2 days, then 5 tabs for 2 days, then 4 tabs for 2 days, then 3 tabs for 2 days, 2 tabs for 2 days, then 1 tab by mouth daily for 2 days 06/24/20  Yes Mary Molina  albuterol (PROVENTIL) (2.5 MG/3ML) 0.083% nebulizer solution Take 2.5 mg by nebulization daily as needed.  11/10/19   Provider, Historical, Molina  baclofen (LIORESAL) 10 MG tablet Take 10 mg by mouth 3 (three) times daily. 12/18/19   Provider, Historical, Molina  benzonatate (TESSALON) 100 MG capsule Take 1 capsule (100 mg total) by mouth every 8 (eight) hours. 04/11/20   Mary Molina  EPINEPHrine 0.3 mg/0.3 mL IJ SOAJ injection Inject 0.3 mLs (0.3 mg total) into the muscle as needed for anaphylaxis. 12/26/19   Mary Molina  ondansetron (ZOFRAN ODT) 4 MG disintegrating tablet Take  1 tablet (4 mg total) by mouth every 8 (eight) hours as needed for nausea or vomiting. 04/11/20   Mary Molina    Allergies    Banana, Pineapple, Metronidazole, and Tylenol [acetaminophen]  Review of Systems   Review of Systems  Constitutional: Positive for fatigue. Negative for chills and fever.  HENT: Negative for congestion.   Eyes: Negative for pain.  Respiratory: Positive for shortness of breath. Negative for cough.   Cardiovascular: Positive for palpitations. Negative for chest pain and leg swelling.  Gastrointestinal: Negative for abdominal pain and vomiting.  Genitourinary: Negative for dysuria.  Musculoskeletal: Negative for myalgias.  Skin: Positive for rash.  Neurological: Negative for  dizziness and headaches.    Physical Exam Updated Vital Signs BP 124/70 (BP Location: Right Arm)   Pulse 86   Temp 98 F (36.7 C) (Oral)   Resp 20   Ht 5\' 6"  (1.676 m)   Wt 95.3 kg   LMP 06/17/2020   SpO2 99%   BMI 33.89 kg/m   Physical Exam Vitals and nursing note reviewed.  Constitutional:      General: She is not in acute distress.    Appearance: She is obese.     Comments: Pleasant well-appearing 25 year old female.  In no acute distress.  Sitting comfortably in bed.  Able answer questions appropriately follow commands. No increased work of breathing. Speaking in full sentences.  HENT:     Head: Normocephalic and atraumatic.     Nose: Nose normal.     Mouth/Throat:     Mouth: Mucous membranes are moist.  Eyes:     General: No scleral icterus. Cardiovascular:     Rate and Rhythm: Normal rate and regular rhythm.     Pulses: Normal pulses.     Heart sounds: Normal heart sounds.  Pulmonary:     Effort: Pulmonary effort is normal. No respiratory distress.     Breath sounds: No wheezing.     Comments: Lungs are clear to auscultation all fields.  No tachypnea.  SPO2 100% on room air.  No increased work of breathing.  No wheezes. Abdominal:     Palpations: Abdomen is soft.     Tenderness: There is no abdominal tenderness. There is no guarding or rebound.     Comments: Soft nontender abdomen.  No guarding or rebound.  No right upper quadrant pain with palpation and negative Murphy sign.  Musculoskeletal:     Cervical back: Normal range of motion.     Right lower leg: No edema.     Left lower leg: No edema.     Comments: No calf pain or lower extremity tenderness.  Skin:    General: Skin is warm and dry.     Capillary Refill: Capillary refill takes less than 2 seconds.     Comments: Patchy hives present over bilateral wrists few scattered hives on back 1 patch approximately 2 x 3 cm on chest no other evident skin abnormalities.  Skin is warm to touch and dry.   Neurological:     Mental Status: She is alert. Mental status is at baseline.  Psychiatric:        Mood and Affect: Mood normal.        Behavior: Behavior normal.     ED Results / Procedures / Treatments   Labs (all labs ordered are listed, but only abnormal results are displayed) Labs Reviewed  BASIC METABOLIC PANEL - Abnormal; Notable for the following components:      Result  Value   CO2 21 (*)    Glucose, Bld 155 (*)    All other components within normal limits  CBC - Abnormal; Notable for the following components:   WBC 11.6 (*)    Hemoglobin 15.1 (*)    All other components within normal limits  I-STAT BETA HCG BLOOD, ED ([facility], [facility], [facility] ONLY)  TROPONIN I (HIGH SENSITIVITY)  TROPONIN I (HIGH SENSITIVITY)    EKG None  Radiology DG Chest 2 View  Result Date: 06/24/2020 CLINICAL DATA:  Chest pain EXAM: CHEST - 2 VIEW COMPARISON:  04/11/2020 FINDINGS: The heart size and mediastinal contours are within normal limits. Both lungs are clear. The visualized skeletal structures are unremarkable. IMPRESSION: No active cardiopulmonary disease. Electronically Signed   By: Kennith Center M.D.   On: 06/24/2020 13:18    Procedures Procedures (including critical care time)  Medications Ordered in ED Medications  diphenhydrAMINE (BENADRYL) capsule 50 mg (50 mg Oral Given 06/24/20 2155)  famotidine (PEPCID) tablet 40 mg (40 mg Oral Given 06/24/20 2154)  predniSONE (DELTASONE) tablet 60 mg (60 mg Oral Given 06/24/20 2155)    ED Course  I have reviewed the triage vital signs and the nursing notes.  Pertinent labs & imaging results that were available during my care of the patient were reviewed by me and considered in my medical decision making (see chart for details).    MDM Rules/Calculators/A&P                          Patient is 25 year old female with past medical history detailed in HPI presented today with primary concern of hives and itchy rash.  She states that this has been  going on for 2 days.  It is similar to episode she has had in the past which seemed to spontaneously resolve although she is uncertain whether this resolved because of famotidine or whether he would have spontaneously resolved on his own.  She has been seen for this/similar symptoms in the past twice.  She states she has actually had 3 episodes in total.  Physical exam is notable for normal vital signs she is not tachycardic tachypneic she is not hypoxic and she is without any wheezing.  She does have hives present on bilateral wrists and back and 1 lesion on chest.  She is actively itching these areas during my examination.  She did complain of some chest tightness and shortness of breath therefore received a cardiac work-up which is reassuring.  Troponin at 5/3.  EKG without any acute abnormalities it is not changed from prior although she has what appears to be baseline S1Q3T3 this presentation is not consistent with PE.  CBC with very mild leukocytosis however also has increased hemoglobin likely due to dehydration.  She is afebrile without any evidence of infection.  She has no urinary symptoms.  BMP unremarkable mildly elevated blood sugar.  No anion gap.  Not in DKA.  I-STAT Hg negative for pregnancy.  Chest x-ray reviewed myself for any acute abnormality.  Patient is well-appearing.  She is itching but otherwise does not appear to be in acute distress.  Provided patient with Benadryl, prednisone, Pepcid and will discharge with same medications.  She already has epinephrine pen.  She will follow-up with allergist.  I gave her the information for their office.  Final Clinical Impression(Molina) / ED Diagnoses Final diagnoses:  Allergic reaction, initial encounter    Rx / DC Orders ED Discharge Orders  Ordered    predniSONE (STERAPRED UNI-PAK 21 TAB) 10 MG (21) TBPK tablet  Daily        06/24/20 2245    famotidine (PEPCID) 20 MG tablet  2 times daily        06/24/20 2245     diphenhydrAMINE (BENADRYL) 25 MG tablet  Every 6 hours PRN        06/24/20 2245           Gailen Shelter, [place] 06/24/20 2254    Pollyann Savoy, Molina 06/24/20 2322

## 2020-06-24 NOTE — ED Triage Notes (Signed)
Pt reports she is here today due to chest pain , sob and allergic reaction. Pt reports that x2 days ago she developed a rash all over her body.Pt reports she has been seen for it x3 but unknown cause.Pt airway intact at this time.

## 2020-06-24 NOTE — ED Notes (Signed)
Discharge instructions gone over with patient. Pt requests to speak with provider regarding lab / blood work results.

## 2020-06-24 NOTE — Discharge Instructions (Addendum)
Please take Benadryl every 6 hours for itching and hives, also take Pepcid twice daily as prescribed.  I have also provided you with a steroid taper down.   I have also given you a allergist to follow-up with.  Please follow-up with your primary care doctor as well.  If you do not have a primary care doctor you may follow-up with the [place] [facility] or if you have insurance you may follow-up with any covered provider in network.  You may always return to the ER for any new or concerning symptoms.  You also have your EpiPen that you were prescribed at your last visit-if you use this please come immediately to the emergency room for evaluation.

## 2020-06-24 NOTE — ED Notes (Signed)
Provider at bedside

## 2020-11-15 ENCOUNTER — Encounter (HOSPITAL_COMMUNITY): Payer: Self-pay

## 2020-11-15 ENCOUNTER — Emergency Department (HOSPITAL_COMMUNITY): Payer: Self-pay

## 2020-11-15 ENCOUNTER — Other Ambulatory Visit: Payer: Self-pay

## 2020-11-15 ENCOUNTER — Emergency Department (HOSPITAL_COMMUNITY)
Admission: EM | Admit: 2020-11-15 | Discharge: 2020-11-15 | Disposition: A | Discharge status: Home / Self Care | Payer: Self-pay | Attending: Emergency Medicine | Admitting: Emergency Medicine

## 2020-11-15 DIAGNOSIS — I10 Essential (primary) hypertension: Secondary | ICD-10-CM | POA: Insufficient documentation

## 2020-11-15 DIAGNOSIS — F17210 Nicotine dependence, cigarettes, uncomplicated: Secondary | ICD-10-CM | POA: Insufficient documentation

## 2020-11-15 DIAGNOSIS — J45909 Unspecified asthma, uncomplicated: Secondary | ICD-10-CM | POA: Insufficient documentation

## 2020-11-15 DIAGNOSIS — M654 Radial styloid tenosynovitis [de Quervain]: Secondary | ICD-10-CM | POA: Insufficient documentation

## 2020-11-15 MED ORDER — NAPROXEN 500 MG PO TABS: 500.0000 mg | ORAL_TABLET | Freq: Two times a day (BID) | ORAL | 0 refills | Status: AC

## 2020-11-15 NOTE — Discharge Instructions (Addendum)
You may alternate taking Tylenol and Naproxen as needed for pain control. You may take Naproxen twice daily as directed on your discharge paperwork and you may take  [phone] mg of Tylenol every 6 hours. Do not exceed 4000 mg of Tylenol daily as this can lead to liver damage. Also, make sure to take Naproxen with meals as it can cause an upset stomach. Do not take other NSAIDs while taking Naproxen such as (Aleve, Ibuprofen, Aspirin, Celebrex, etc) and do not take more than the prescribed dose as this can lead to ulcers and bleeding in your GI tract. You may use warm and cold compresses to help with your symptoms.   Please follow up with the hand doctor within the next 7-10 days for re-evaluation and further treatment of your symptoms.   Please return to the ER sooner if you have any new or worsening symptoms.

## 2020-11-15 NOTE — ED Triage Notes (Signed)
Pt states since 09/29/20 she has had pain in her right wrist. Pt states she has limited mobility with her hand and her right hand is weaker. Pt states she has not been seen for this pain in her right wrist.

## 2020-11-15 NOTE — ED Provider Notes (Signed)
[facility]-EMERGENCY DEPT Provider Note   CSN: [id number] Arrival date & time: 11/15/20  1449     History Chief Complaint  Patient presents with   Wrist Pain    Mary Molina is a 25 y.o. female.  HPI  25 year old female with a history of asthma, hypertension, who presents the emergency department today for evaluation of right wrist pain.  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  The pain is along the wrist and thumb.  It is worse when she abducts and abducts her wrist.  She denies any injuries or trauma. Denies numbness/weakness. She has been using icy hot and a splint   Past Medical History:  Diagnosis Date   Asthma    Hypertension     There are no problems to display for this patient.   History reviewed. No pertinent surgical history.   OB History   No obstetric history on file.     History reviewed. No pertinent family history.  Social History   Tobacco Use   Smoking status: Every Day    Pack years: 0.00    Types: Cigarettes   Smokeless tobacco: Never    Home Medications Prior to Admission medications   Medication Sig Start Date End Date Taking? Authorizing Provider  naproxen (NAPROSYN) 500 MG tablet Take 1 tablet (500 mg total) by mouth 2 (two) times daily for 7 days. 11/15/20 11/22/20 Yes Micharl Helmes S, PA-C  albuterol (PROVENTIL) (2.5 MG/3ML) 0.083% nebulizer solution Take 2.5 mg by nebulization daily as needed.  11/10/19   [provider]  baclofen (LIORESAL) 10 MG tablet Take 10 mg by mouth 3 (three) times daily. 12/18/19   [provider]  benzonatate (TESSALON) 100 MG capsule Take 1 capsule (100 mg total) by mouth every 8 (eight) hours. 04/11/20   Melene Plan, DO  diphenhydrAMINE (BENADRYL) 25 MG tablet Take 1 tablet (25 mg total) by mouth every 6 (six) hours as needed. 06/24/20   Gailen Shelter, PA  EPINEPHrine 0.3 mg/0.3 mL IJ SOAJ injection Inject 0.3 mLs (0.3 mg  total) into the muscle as needed for anaphylaxis. 12/26/19   Nira Conn, MD  famotidine (PEPCID) 20 MG tablet Take 1 tablet (20 mg total) by mouth 2 (two) times daily. 06/24/20   Gailen Shelter, PA  ondansetron (ZOFRAN ODT) 4 MG disintegrating tablet Take 1 tablet (4 mg total) by mouth every 8 (eight) hours as needed for nausea or vomiting. 04/11/20   Melene Plan, DO  predniSONE (STERAPRED UNI-PAK 21 TAB) 10 MG (21) TBPK tablet Take by mouth daily. Take 6 tabs by mouth daily  for 2 days, then 5 tabs for 2 days, then 4 tabs for 2 days, then 3 tabs for 2 days, 2 tabs for 2 days, then 1 tab by mouth daily for 2 days 06/24/20   Gailen Shelter, PA    Allergies    Banana, Pineapple, Metronidazole, and Tylenol [acetaminophen]  Review of Systems   Review of Systems  Constitutional:  Negative for fever.  Musculoskeletal:        R wrist pain  Skin:  Negative for wound.  Neurological:  Negative for weakness and numbness.   Physical Exam Updated Vital Signs BP (!) 106/95   Pulse 98   Temp 98.6 F (37 C)   Resp 16   Ht 5\' 6"  (1.676 m)   Wt 98 kg   LMP 11/15/2020   SpO2  100%   BMI 34.86 kg/m   Physical Exam Vitals and nursing note reviewed.  Constitutional:      General: She is not in acute distress.    Appearance: She is well-developed.  HENT:     Head: Normocephalic and atraumatic.  Eyes:     Conjunctiva/sclera: Conjunctivae normal.  Cardiovascular:     Rate and Rhythm: Normal rate.  Pulmonary:     Effort: Pulmonary effort is normal.  Musculoskeletal:        General: Normal range of motion.     Cervical back: Neck supple.     Comments: TTP along the EPB and and APL of the right wrist. 5/5 grip strength. Normal sensation  Skin:    General: Skin is warm and dry.  Neurological:     Mental Status: She is alert.    ED Results / Procedures / Treatments   Labs (all labs ordered are listed, but only abnormal results are displayed) Labs Reviewed - No data to  display  EKG None  Radiology DG Wrist Complete Right  Result Date: 11/15/2020 CLINICAL DATA:  Right wrist pain for several months, initial encounter EXAM: RIGHT WRIST - COMPLETE 3+ VIEW COMPARISON:  None. FINDINGS: There is no evidence of fracture or dislocation. There is no evidence of arthropathy or other focal bone abnormality. Soft tissues are unremarkable. IMPRESSION: No acute abnormality noted. Electronically Signed   By: Alcide Clever M.D.   On: 11/15/2020 16:23    Procedures Procedures   Medications Ordered in ED Medications - No data to display  ED Course  I have reviewed the triage vital signs and the nursing notes.  Pertinent labs & imaging results that were available during my care of the patient were reviewed by me and considered in my medical decision making (see chart for details).    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  Exam consistent with de Quervain's tenosynovitis, x-ray obtained to rule out any bony injury which did not show any evidence of this.  Patient was given a thumb spica and Rx for naproxen.  She will be given referral to hand surgery for further symptoms.  Advised on specific return precautions.  She voices understanding of the plan and reasons to return.  All questions answered.  Patient stable for discharge.  Final Clinical Impression(s) / ED Diagnoses Final diagnoses:  De Quervain's tenosynovitis, right    Rx / DC Orders ED Discharge Orders          Ordered    naproxen (NAPROSYN) 500 MG tablet  2 times daily        11/15/20 1631             Tyjai Charbonnet S, PA-C 11/15/20 1631    Derwood Kaplan, MD 11/16/20 1003

## 2020-11-15 NOTE — Progress Notes (Signed)
Orthopedic Tech Progress Note Patient Details:  Honestii Marton 05-12-1996 [id number]  Ortho Devices Type of Ortho Device: Thumb velcro splint Ortho Device/Splint Location: Patient Right Ortho Device/Splint Interventions: Ordered, Application, Adjustment   Post Interventions Patient Tolerated: Well Instructions Provided: Adjustment of device, Care of device, Poper ambulation with device  Martina Sinner Vonzell Lindblad 11/15/2020, 5:06 PM

## 2020-12-30 ENCOUNTER — Emergency Department (HOSPITAL_COMMUNITY)
Admission: EM | Admit: 2020-12-30 | Discharge: 2020-12-31 | Disposition: A | Discharge status: Home / Self Care | Payer: Medicaid Other | Attending: Emergency Medicine | Admitting: Emergency Medicine

## 2020-12-30 ENCOUNTER — Other Ambulatory Visit: Payer: Self-pay

## 2020-12-30 ENCOUNTER — Emergency Department (HOSPITAL_COMMUNITY): Payer: Medicaid Other

## 2020-12-30 ENCOUNTER — Encounter (HOSPITAL_COMMUNITY): Payer: Self-pay | Admitting: Emergency Medicine

## 2020-12-30 DIAGNOSIS — M62830 Muscle spasm of back: Secondary | ICD-10-CM | POA: Insufficient documentation

## 2020-12-30 DIAGNOSIS — M549 Dorsalgia, unspecified: Secondary | ICD-10-CM

## 2020-12-30 DIAGNOSIS — F17210 Nicotine dependence, cigarettes, uncomplicated: Secondary | ICD-10-CM | POA: Insufficient documentation

## 2020-12-30 DIAGNOSIS — J45909 Unspecified asthma, uncomplicated: Secondary | ICD-10-CM | POA: Insufficient documentation

## 2020-12-30 DIAGNOSIS — I10 Essential (primary) hypertension: Secondary | ICD-10-CM | POA: Insufficient documentation

## 2020-12-30 LAB — PREGNANCY, URINE: Preg Test, Ur: NEGATIVE

## 2020-12-30 NOTE — ED Notes (Signed)
Pt stepped outside.  

## 2020-12-30 NOTE — ED Provider Notes (Signed)
Emergency Medicine Provider Triage Evaluation Note  Mary Molina , a 25 y.o. female  was evaluated in triage.  Pt complains of lumbar pain, started 1.5 weeks ago, came on suddenly, radiates into  her knees, denies paresthesia or weakness in her lower extremities, denies urinary incontinence, difficulty with bowel movements, saddle paresthesias, denies trauma to the area.  Review of Systems  Positive: Back pain, leg pain Negative: Paresthesias or weakness in lower extremities  Physical Exam  There were no vitals taken for this visit. Gen:   Awake, no distress   Resp:  Normal effort  MSK:   Moves extremities without difficulty  Other:    Medical Decision Making  Medically screening exam initiated at 7:01 PM.  Appropriate orders placed.  Mary Molina was informed that the remainder of the evaluation will be completed by another provider, this initial triage assessment does not replace that evaluation, and the importance of remaining in the ED until their evaluation is complete.  Presents with back pain patient will need further work-up.   Mary Sage, PA-C 12/30/20 Mary Houston, MD 12/31/20 [phone]

## 2020-12-30 NOTE — ED Triage Notes (Signed)
Pt c/o right sided back pain that radiates through her hip x 2 weeks. Pt reports that she has been having difficulty standing and completing ADLs.

## 2020-12-31 MED ORDER — IBUPROFEN 400 MG PO TABS
600.0000 mg | ORAL_TABLET | Freq: Once | ORAL | Status: AC
  Administered 2020-12-31: 600 mg via ORAL
  Filled 2020-12-31: qty 1

## 2020-12-31 MED ORDER — IBUPROFEN 600 MG PO TABS: 600.0000 mg | ORAL_TABLET | Freq: Four times a day (QID) | ORAL | 0 refills | Status: DC | PRN

## 2020-12-31 MED ORDER — METHOCARBAMOL 500 MG PO TABS: 500.0000 mg | ORAL_TABLET | Freq: Two times a day (BID) | ORAL | 0 refills | Status: DC

## 2020-12-31 NOTE — Discharge Instructions (Addendum)
Take medications as prescribed. Rest the back as we discussed for the next 2-3 days. Warm compresses as instructed.   Follow up with primary care if pain continues. If you do not have a primary care provider, you can call [facility] for further/routine care.

## 2020-12-31 NOTE — ED Notes (Signed)
Patient verbalizes understanding of discharge instructions. Prescriptions and pain management reviewed. Opportunity for questioning and answers were provided. Armband removed by staff, pt discharged from ED ambulatory.  

## 2020-12-31 NOTE — ED Provider Notes (Signed)
[facility] EMERGENCY DEPARTMENT Provider Note   CSN: [id number] Arrival date & time: 12/30/20  1813     History Chief Complaint  Patient presents with   Back Pain    Mary Molina is a 25 y.o. female.  Patient to ED for evaluation of progressively worsening back pain that started about 10 days ago. No injury. No recent history of heavy lifting. No previous or recurrent back pain issues. No abdominal pain. The pain is worse when trying to stand from sitting, better with rest. Today, she was standing at the sink and felt sudden onset of severe, sharp pain that made her fall to her knees. No flank pain or fever. No urinary symptoms.   The history is provided by the patient. No language interpreter was used.  Back Pain Associated symptoms: no abdominal pain, no chest pain, no dysuria, no fever, no numbness and no weakness       Past Medical History:  Diagnosis Date   Asthma    Hypertension     There are no problems to display for this patient.   History reviewed. No pertinent surgical history.   OB History   No obstetric history on file.     No family history on file.  Social History   Tobacco Use   Smoking status: Every Day    Types: Cigarettes   Smokeless tobacco: Never    Home Medications Prior to Admission medications   Medication Sig Start Date End Date Taking? Authorizing Provider  albuterol (PROVENTIL) (2.5 MG/3ML) 0.083% nebulizer solution Take 2.5 mg by nebulization daily as needed.  11/10/19   [provider]  baclofen (LIORESAL) 10 MG tablet Take 10 mg by mouth 3 (three) times daily. 12/18/19   [provider]  benzonatate (TESSALON) 100 MG capsule Take 1 capsule (100 mg total) by mouth every 8 (eight) hours. 04/11/20   Melene Plan, DO  diphenhydrAMINE (BENADRYL) 25 MG tablet Take 1 tablet (25 mg total) by mouth every 6 (six) hours as needed. 06/24/20   Gailen Shelter, PA  EPINEPHrine 0.3 mg/0.3 mL IJ SOAJ injection Inject  0.3 mLs (0.3 mg total) into the muscle as needed for anaphylaxis. 12/26/19   Nira Conn, MD  famotidine (PEPCID) 20 MG tablet Take 1 tablet (20 mg total) by mouth 2 (two) times daily. 06/24/20   Gailen Shelter, PA  ondansetron (ZOFRAN ODT) 4 MG disintegrating tablet Take 1 tablet (4 mg total) by mouth every 8 (eight) hours as needed for nausea or vomiting. 04/11/20   Melene Plan, DO  predniSONE (STERAPRED UNI-PAK 21 TAB) 10 MG (21) TBPK tablet Take by mouth daily. Take 6 tabs by mouth daily  for 2 days, then 5 tabs for 2 days, then 4 tabs for 2 days, then 3 tabs for 2 days, 2 tabs for 2 days, then 1 tab by mouth daily for 2 days 06/24/20   Gailen Shelter, PA    Allergies    Banana, Pineapple, Metronidazole, and Tylenol [acetaminophen]  Review of Systems   Review of Systems  Constitutional:  Negative for fever.  Respiratory: Negative.  Negative for shortness of breath.   Cardiovascular: Negative.  Negative for chest pain.  Gastrointestinal: Negative.  Negative for abdominal pain, nausea and vomiting.  Genitourinary:  Negative for dysuria and flank pain.  Musculoskeletal:  Positive for back pain.       See HPI.  Skin: Negative.  Negative for color change.  Neurological: Negative.  Negative for weakness  and numbness.   Physical Exam Updated Vital Signs BP 128/72   Pulse 81   Temp 98.8 F (37.1 C)   Resp 18   SpO2 100%   Physical Exam Vitals and nursing note reviewed.  Constitutional:      General: She is not in acute distress.    Appearance: She is well-developed. She is not ill-appearing.  Pulmonary:     Effort: Pulmonary effort is normal.  Musculoskeletal:        General: Normal range of motion.     Cervical back: Normal range of motion.       Back:  Skin:    General: Skin is warm and dry.  Neurological:     Mental Status: She is alert and oriented to person, place, and time.    ED Results / Procedures / Treatments   Labs (all labs ordered are listed, but  only abnormal results are displayed) Labs Reviewed  PREGNANCY, URINE    EKG None  Radiology DG Lumbar Spine Complete  Result Date: 12/30/2020 CLINICAL DATA:  Lumbar pain EXAM: LUMBAR SPINE - COMPLETE 4+ VIEW COMPARISON:  None. FINDINGS: Five lumbar vertebrae. No spondylolysis or spondylolisthesis. No acute vertebral body fracture or height loss. Preservation of the disc spaces. Normal bone mineralization. No worrisome osseous lesions. Bones of the pelvis appear intact and congruent. Soft tissues are noncontributory. IMPRESSION: Negative. Electronically Signed   By: Kreg Shropshire M.D.   On: 12/30/2020 20:50    Procedures Procedures   Medications Ordered in ED Medications - No data to display  ED Course  I have reviewed the triage vital signs and the nursing notes.  Pertinent labs & imaging results that were available during my care of the patient were reviewed by me and considered in my medical decision making (see chart for details).    MDM Rules/Calculators/A&P                           Patient to ED with back pain for 10 days, today with sudden onset sharp pain.   Tender to back musculature. Without infection/fever, and with pattern of muscular type pain, likely with start of spasm today. Will start on Robaxin and 600 mg ibuprofen. Recommend PCP follow up - will provide referral.   Final Clinical Impression(s) / ED Diagnoses Final diagnoses:  None   Back pain Muscular spasm  Rx / DC Orders ED Discharge Orders     None        Elpidio Anis, PA-C 12/31/20 0357    Melene Plan, DO 12/31/20 [phone]

## 2021-02-15 ENCOUNTER — Encounter (HOSPITAL_COMMUNITY): Payer: Self-pay | Admitting: Emergency Medicine

## 2021-02-15 ENCOUNTER — Other Ambulatory Visit: Payer: Self-pay

## 2021-02-15 ENCOUNTER — Emergency Department (HOSPITAL_COMMUNITY): Payer: Self-pay

## 2021-02-15 ENCOUNTER — Emergency Department (HOSPITAL_COMMUNITY)
Admission: EM | Admit: 2021-02-15 | Discharge: 2021-02-15 | Disposition: A | Discharge status: Home / Self Care | Payer: Self-pay | Attending: Emergency Medicine | Admitting: Emergency Medicine

## 2021-02-15 DIAGNOSIS — J45909 Unspecified asthma, uncomplicated: Secondary | ICD-10-CM | POA: Insufficient documentation

## 2021-02-15 DIAGNOSIS — F17210 Nicotine dependence, cigarettes, uncomplicated: Secondary | ICD-10-CM | POA: Insufficient documentation

## 2021-02-15 DIAGNOSIS — K8050 Calculus of bile duct without cholangitis or cholecystitis without obstruction: Secondary | ICD-10-CM | POA: Insufficient documentation

## 2021-02-15 DIAGNOSIS — R1013 Epigastric pain: Secondary | ICD-10-CM

## 2021-02-15 DIAGNOSIS — I10 Essential (primary) hypertension: Secondary | ICD-10-CM | POA: Insufficient documentation

## 2021-02-15 DIAGNOSIS — K5900 Constipation, unspecified: Secondary | ICD-10-CM | POA: Insufficient documentation

## 2021-02-15 DIAGNOSIS — R112 Nausea with vomiting, unspecified: Secondary | ICD-10-CM

## 2021-02-15 LAB — URINALYSIS, ROUTINE W REFLEX MICROSCOPIC
Bilirubin Urine: NEGATIVE
Glucose, UA: NEGATIVE mg/dL
Ketones, ur: NEGATIVE mg/dL
Leukocytes,Ua: NEGATIVE
Nitrite: NEGATIVE
Protein, ur: 30 mg/dL — AB
Specific Gravity, Urine: 1.029 (ref 1.005–1.030)
pH: 5 (ref 5.0–8.0)

## 2021-02-15 LAB — COMPREHENSIVE METABOLIC PANEL
ALT: 20 U/L (ref 0–44)
AST: 17 U/L (ref 15–41)
Albumin: 4 g/dL (ref 3.5–5.0)
Alkaline Phosphatase: 51 U/L (ref 38–126)
Anion gap: 10 (ref 5–15)
BUN: 7 mg/dL (ref 6–20)
CO2: 20 mmol/L — ABNORMAL LOW (ref 22–32)
Calcium: 9 mg/dL (ref 8.9–10.3)
Chloride: 104 mmol/L (ref 98–111)
Creatinine, Ser: 0.7 mg/dL (ref 0.44–1.00)
GFR, Estimated: >60 mL/min (ref >60)
Glucose, Bld: 121 mg/dL — ABNORMAL HIGH (ref 70–99)
Potassium: 3.7 mmol/L (ref 3.5–5.1)
Sodium: 134 mmol/L — ABNORMAL LOW (ref 135–145)
Total Bilirubin: 0.8 mg/dL (ref 0.3–1.2)
Total Protein: 6.7 g/dL (ref 6.5–8.1)

## 2021-02-15 LAB — CBC WITH DIFFERENTIAL/PLATELET
Abs Immature Granulocytes: 0.04 10*3/uL (ref 0.00–0.07)
Basophils Absolute: 0.1 10*3/uL (ref 0.0–0.1)
Basophils Relative: 0 %
Eosinophils Absolute: 0.2 10*3/uL (ref 0.0–0.5)
Eosinophils Relative: 2 %
HCT: 42.5 % (ref 36.0–46.0)
Hemoglobin: 13.8 g/dL (ref 12.0–15.0)
Immature Granulocytes: 0 %
Lymphocytes Relative: 29 %
Lymphs Abs: 3.3 10*3/uL (ref 0.7–4.0)
MCH: 29.9 pg (ref 26.0–34.0)
MCHC: 32.5 g/dL (ref 30.0–36.0)
MCV: 92 fL (ref 80.0–100.0)
Monocytes Absolute: 0.5 10*3/uL (ref 0.1–1.0)
Monocytes Relative: 4 %
Neutro Abs: 7.3 10*3/uL (ref 1.7–7.7)
Neutrophils Relative %: 65 %
Platelets: 286 10*3/uL (ref 150–400)
RBC: 4.62 MIL/uL (ref 3.87–5.11)
RDW: 12.9 % (ref 11.5–15.5)
WBC: 11.3 10*3/uL — ABNORMAL HIGH (ref 4.0–10.5)
nRBC: 0 % (ref 0.0–0.2)

## 2021-02-15 LAB — I-STAT BETA HCG BLOOD, ED (MC, WL, AP ONLY): I-stat hCG, quantitative: <5 m[IU]/mL (ref <5)

## 2021-02-15 LAB — LIPASE, BLOOD: Lipase: 37 U/L (ref 11–51)

## 2021-02-15 MED ORDER — ONDANSETRON 4 MG PO TBDP: 4.0000 mg | ORAL_TABLET | Freq: Three times a day (TID) | ORAL | 0 refills | Status: DC | PRN

## 2021-02-15 NOTE — ED Triage Notes (Signed)
Patient with abdominal pain, nausea and vomiting.  She states that she was diagnosed with peptic ulcers last month.  She states she had diarrhea earlier today.

## 2021-02-15 NOTE — ED Provider Notes (Signed)
Emergency Medicine Provider Triage Evaluation Note  Mary Molina , a 25 y.o. female  was evaluated in triage.  Pt complains of N/V/D. Diarrhea onset this AM with some abdominal cramping. N/V began tonight around 2200. Has been having some abdominal bloating, increased urinary frequency. No fevers, hematemesis, melena, hematochezia, dysuria.  No history of abdominal surgeries.  Took Zofran prior to arrival without relief of nausea.  Has been compliant with her Carafate and pantoprazole for management of PUD.  Review of Systems  Positive: N/V/D Negative: Fever, dysuria  Physical Exam  BP (!) 144/98 (BP Location: Left Arm)   Pulse 77   Temp 98.4 F (36.9 C)   Resp 16   SpO2 100%  Gen:   Awake, no distress   Resp:  Normal effort  MSK:   Moves extremities without difficulty  Other:  General abdominal TTP without focality. No peritoneal signs.  Medical Decision Making  Medically screening exam initiated at 2:53 AM.  Appropriate orders placed.  Mary Molina was informed that the remainder of the evaluation will be completed by another provider, this initial triage assessment does not replace that evaluation, and the importance of remaining in the ED until their evaluation is complete.  N/V/D   Antony Madura, PA-C 02/15/21 0255    Eber Hong, MD 02/15/21 [phone]

## 2021-02-15 NOTE — ED Provider Notes (Signed)
[facility] EMERGENCY DEPARTMENT Provider Note   CSN: [id number] Arrival date & time: 02/15/21  6546     History Chief Complaint  Patient presents with   Abdominal Pain    Mary Molina is a 25 y.o. female smoker with h/o PUD and cholelithiasis presents to the ED for diffuse abdominal pain since 2230 yesterday with nausea and vomiting. She was diagnosed with possible PUD in August and was started on Sulfurate and Protonix. Last episodes of gallstones was 2 years ago and she followed up with a general surgeon, but was unable to afford surgery due to insurance status. She denies any dysuria, hematuria, dark stools, hematochezia, fevers, chest pain, or SOB. Denies any abdominal surgeries. Medical history additionally includes asthma and HTN.   Abdominal Pain Associated symptoms: constipation, nausea and vomiting   Associated symptoms: no chest pain, no chills, no cough, no dysuria, no fever, no hematuria, no shortness of breath, no sore throat and no vaginal discharge       Past Medical History:  Diagnosis Date   Asthma    Hypertension     There are no problems to display for this patient.   History reviewed. No pertinent surgical history.   OB History   No obstetric history on file.     History reviewed. No pertinent family history.  Social History   Tobacco Use   Smoking status: Every Day    Types: Cigarettes   Smokeless tobacco: Never    Home Medications Prior to Admission medications   Medication Sig Start Date End Date Taking? Authorizing Provider  ondansetron (ZOFRAN ODT) 4 MG disintegrating tablet Take 1 tablet (4 mg total) by mouth every 8 (eight) hours as needed for nausea or vomiting. 02/15/21  Yes Achille Rich, PA-C  albuterol (PROVENTIL) (2.5 MG/3ML) 0.083% nebulizer solution Take 2.5 mg by nebulization daily as needed.  11/10/19   [provider]  baclofen (LIORESAL) 10 MG tablet Take 10 mg by mouth 3 (three) times daily. 12/18/19    [provider]  benzonatate (TESSALON) 100 MG capsule Take 1 capsule (100 mg total) by mouth every 8 (eight) hours. 04/11/20   Melene Plan, DO  diphenhydrAMINE (BENADRYL) 25 MG tablet Take 1 tablet (25 mg total) by mouth every 6 (six) hours as needed. 06/24/20   Gailen Shelter, PA  EPINEPHrine 0.3 mg/0.3 mL IJ SOAJ injection Inject 0.3 mLs (0.3 mg total) into the muscle as needed for anaphylaxis. 12/26/19   Nira Conn, MD  famotidine (PEPCID) 20 MG tablet Take 1 tablet (20 mg total) by mouth 2 (two) times daily. 06/24/20   Gailen Shelter, PA  ibuprofen (ADVIL) 600 MG tablet Take 1 tablet (600 mg total) by mouth every 6 (six) hours as needed. 12/31/20   Elpidio Anis, PA-C  methocarbamol (ROBAXIN) 500 MG tablet Take 1 tablet (500 mg total) by mouth 2 (two) times daily. 12/31/20   Elpidio Anis, PA-C  predniSONE (STERAPRED UNI-PAK 21 TAB) 10 MG (21) TBPK tablet Take by mouth daily. Take 6 tabs by mouth daily  for 2 days, then 5 tabs for 2 days, then 4 tabs for 2 days, then 3 tabs for 2 days, 2 tabs for 2 days, then 1 tab by mouth daily for 2 days 06/24/20   Gailen Shelter, PA    Allergies    Banana, Pineapple, Metronidazole, and Tylenol [acetaminophen]  Review of Systems   Review of Systems  Constitutional:  Negative for chills and fever.  HENT:  Negative for ear pain and sore throat.   Eyes:  Negative for pain and visual disturbance.  Respiratory:  Negative for cough and shortness of breath.   Cardiovascular:  Negative for chest pain and palpitations.  Gastrointestinal:  Positive for abdominal pain, constipation, nausea and vomiting. Negative for anal bleeding, blood in stool and rectal pain.  Genitourinary:  Negative for decreased urine volume, difficulty urinating, dysuria, flank pain, frequency, hematuria, urgency and vaginal discharge.  Musculoskeletal:  Negative for arthralgias and back pain.  Skin:  Negative for color change and rash.  Neurological:  Negative for  seizures and syncope.  All other systems reviewed and are negative.  Physical Exam Updated Vital Signs BP 126/60 (BP Location: Right Arm)   Pulse 87   Temp 98.5 F (36.9 C) (Oral)   Resp 20   SpO2 100%   Physical Exam Vitals and nursing note reviewed.  Constitutional:      General: She is not in acute distress.    Appearance: Normal appearance. She is not toxic-appearing.  HENT:     Head: Normocephalic and atraumatic.     Mouth/Throat:     Mouth: Mucous membranes are moist.  Eyes:     General: No scleral icterus. Cardiovascular:     Rate and Rhythm: Normal rate and regular rhythm.  Pulmonary:     Effort: Pulmonary effort is normal.     Breath sounds: Wheezing present.     Comments: Mild wheezing at bases. Patient has known asthma and O2 sat is 100%. Abdominal:     General: Abdomen is flat. Bowel sounds are normal. There are no signs of injury.     Palpations: Abdomen is soft. There is no mass.     Tenderness: There is abdominal tenderness.     Hernia: No hernia is present.     Comments: No ecchymosis, rash, or overlying skin changes noted to the abdomen other than faded striae distensae. NBS. Abdomen is soft. No peritoneal signs. Mild generalized tenderness with more tenderness over the RUQ. Positive Murphy sign.   Musculoskeletal:        General: No deformity.     Cervical back: Normal range of motion.  Skin:    General: Skin is warm and dry.  Neurological:     General: No focal deficit present.     Mental Status: She is alert. Mental status is at baseline.    ED Results / Procedures / Treatments   Labs (all labs ordered are listed, but only abnormal results are displayed) Labs Reviewed  CBC WITH DIFFERENTIAL/PLATELET - Abnormal; Notable for the following components:      Result Value   WBC 11.3 (*)    All other components within normal limits  COMPREHENSIVE METABOLIC PANEL - Abnormal; Notable for the following components:   Sodium 134 (*)    CO2 20 (*)     Glucose, Bld 121 (*)    All other components within normal limits  URINALYSIS, ROUTINE W REFLEX MICROSCOPIC - Abnormal; Notable for the following components:   APPearance CLOUDY (*)    Hgb urine dipstick SMALL (*)    Protein, ur 30 (*)    Bacteria, UA MANY (*)    All other components within normal limits  LIPASE, BLOOD  I-STAT BETA HCG BLOOD, ED ([facility], [facility], [facility] ONLY)    EKG None  Radiology US Abdomen Limited RUQ (LIVER/GB)  Result Date: 02/15/2021 CLINICAL DATA:  Epigastric pain EXAM: ULTRASOUND ABDOMEN LIMITED RIGHT UPPER QUADRANT COMPARISON:  None. FINDINGS: Gallbladder:  Shadowing gallstones. No gallbladder wall thickening although there is reported focal tenderness. No pericholecystic inflammation. Common bile duct: Diameter: 4 mm.  Where visualized, no filling defect. Liver: No focal lesion identified. Within normal limits in parenchymal echogenicity. Portal vein is patent on color Doppler imaging with normal direction of blood flow towards the liver. IMPRESSION: Cholelithiasis. There is gallbladder tenderness but no wall thickening or pericholecystic inflammation typical of acute cholecystitis. Electronically Signed   By: Tiburcio Pea M.D.   On: 02/15/2021 04:40    Procedures Procedures   Medications Ordered in ED Medications - No data to display  ED Course  I have reviewed the triage vital signs and the nursing notes.  Pertinent labs & imaging results that were available during my care of the patient were reviewed by me and considered in my medical decision making (see chart for details).  Mary Molina is a 25 y/o F smoker with h/o PUD and cholelithiasis presents to the ED for diffuse abdominal pain since 2230 yesterday with nausea and vomiting. Differential includes cholelithiasis, cholangitis, choledocholithiasis, appendicitis, ovarian torsion, viral gastroenteritis.   I personally have review the patients labs and imaging. Labs show slightly elevated WBC at 11.3, but afebrile.  Physical exam and supporting labs lead me to not concerned for any signs of infection or sepsis. Urine appears cloudy, but negative nitrites, leukocytes, and WBC is 0-5 on microscopy. US shows shadowing gallstones, no gallbladder wall thickening, and no pericholecystic inflammation. Reviewed Abd/Pelv CT report from 01/13/21 from [facility] with no acute findings.   On evaluation of the patient, the patient reports her pain is still present, but lessened. She has not vomited since being here, and her nausea resolved earlier this morning. Consulted general surgery, [place]   I discussed the lab and imaging findings with the patient and discussed the need to follow up with general surgery for removal. General surgery will see her in office. Return precautions given. Prescribed Zofran for nausea. Patient is agreeable to plan. The patient is stable and is being discharged home in good condition.    MDM Rules/Calculators/A&P                          Final Clinical Impression(s) / ED Diagnoses Final diagnoses:  Epigastric pain  Biliary colic  Nausea and vomiting, intractability of vomiting not specified, unspecified vomiting type    Rx / DC Orders ED Discharge Orders          Ordered    ondansetron (ZOFRAN ODT) 4 MG disintegrating tablet  Every 8 hours PRN        02/15/21 1052             Achille Rich, PA-C 02/15/21 1608    Eber Hong, MD 02/18/21 [phone]

## 2021-02-15 NOTE — ED Provider Notes (Signed)
Pt is here wit diffuse abd pain - has had frequently over the last monthy since being dx with gastritis in the ED after neg CT 01/13/21 in [place] - I have reviewed eMR and results - she is on carafate, protonix and still has intermittent pain - had n/v last night - but that has resolved - she has normal labs other than mild leukocytosis and normal LFT's - no specififc RUQ ttp but is mild diffuse ttp - no tympanitic sounds to percussion - had some diarrhea yesterday but rsolved - having no constipation and is passing gas.  No cardiac findings - otherwise wlel appearing without jaundice.  Outpt f/u with surgery recommended for possible biliary colic.   Eber Hong, MD 02/18/21 [phone]

## 2021-02-22 ENCOUNTER — Other Ambulatory Visit: Payer: Self-pay

## 2021-02-22 ENCOUNTER — Emergency Department (HOSPITAL_COMMUNITY): Payer: Self-pay

## 2021-02-22 ENCOUNTER — Emergency Department (HOSPITAL_COMMUNITY)
Admission: EM | Admit: 2021-02-22 | Discharge: 2021-02-22 | Discharge status: Against Medical Advice | Payer: Self-pay | Attending: Emergency Medicine | Admitting: Emergency Medicine

## 2021-02-22 DIAGNOSIS — R519 Headache, unspecified: Secondary | ICD-10-CM | POA: Insufficient documentation

## 2021-02-22 DIAGNOSIS — Z20822 Contact with and (suspected) exposure to covid-19: Secondary | ICD-10-CM | POA: Insufficient documentation

## 2021-02-22 DIAGNOSIS — R319 Hematuria, unspecified: Secondary | ICD-10-CM | POA: Insufficient documentation

## 2021-02-22 DIAGNOSIS — Z5321 Procedure and treatment not carried out due to patient leaving prior to being seen by health care provider: Secondary | ICD-10-CM | POA: Insufficient documentation

## 2021-02-22 DIAGNOSIS — K8020 Calculus of gallbladder without cholecystitis without obstruction: Secondary | ICD-10-CM | POA: Insufficient documentation

## 2021-02-22 DIAGNOSIS — N9489 Other specified conditions associated with female genital organs and menstrual cycle: Secondary | ICD-10-CM | POA: Insufficient documentation

## 2021-02-22 LAB — URINALYSIS, ROUTINE W REFLEX MICROSCOPIC
Bilirubin Urine: NEGATIVE
Glucose, UA: NEGATIVE mg/dL
Ketones, ur: NEGATIVE mg/dL
Leukocytes,Ua: NEGATIVE
Nitrite: NEGATIVE
Protein, ur: NEGATIVE mg/dL
Specific Gravity, Urine: 1.03 (ref 1.005–1.030)
pH: 6 (ref 5.0–8.0)

## 2021-02-22 LAB — LIPASE, BLOOD: Lipase: 33 U/L (ref 11–51)

## 2021-02-22 LAB — COMPREHENSIVE METABOLIC PANEL
ALT: 27 U/L (ref 0–44)
AST: 26 U/L (ref 15–41)
Albumin: 4.2 g/dL (ref 3.5–5.0)
Alkaline Phosphatase: 60 U/L (ref 38–126)
Anion gap: 9 (ref 5–15)
BUN: 9 mg/dL (ref 6–20)
CO2: 22 mmol/L (ref 22–32)
Calcium: 9 mg/dL (ref 8.9–10.3)
Chloride: 103 mmol/L (ref 98–111)
Creatinine, Ser: 0.72 mg/dL (ref 0.44–1.00)
GFR, Estimated: >60 mL/min (ref >60)
Glucose, Bld: 166 mg/dL — ABNORMAL HIGH (ref 70–99)
Potassium: 3.5 mmol/L (ref 3.5–5.1)
Sodium: 134 mmol/L — ABNORMAL LOW (ref 135–145)
Total Bilirubin: 0.8 mg/dL (ref 0.3–1.2)
Total Protein: 7.1 g/dL (ref 6.5–8.1)

## 2021-02-22 LAB — CBC
HCT: 41.3 % (ref 36.0–46.0)
Hemoglobin: 13.6 g/dL (ref 12.0–15.0)
MCH: 29.6 pg (ref 26.0–34.0)
MCHC: 32.9 g/dL (ref 30.0–36.0)
MCV: 90 fL (ref 80.0–100.0)
Platelets: 283 10*3/uL (ref 150–400)
RBC: 4.59 MIL/uL (ref 3.87–5.11)
RDW: 12.9 % (ref 11.5–15.5)
WBC: 9.4 10*3/uL (ref 4.0–10.5)
nRBC: 0 % (ref 0.0–0.2)

## 2021-02-22 LAB — RESP PANEL BY RT-PCR (FLU A&B, COVID) ARPGX2
Influenza A by PCR: NEGATIVE
Influenza B by PCR: NEGATIVE
SARS Coronavirus 2 by RT PCR: NEGATIVE

## 2021-02-22 LAB — HCG, QUANTITATIVE, PREGNANCY: hCG, Beta Chain, Quant, S: <1 m[IU]/mL (ref <5)

## 2021-02-22 NOTE — ED Provider Notes (Signed)
Emergency Medicine Provider Triage Evaluation Note  Labrea Eccleston , a 25 y.o. female  was evaluated in triage.  Pt complains of abdominal pain, back pain, headache, nausea with out vomiting.  This started about 10pm last night.  She reports that she has been taking the prescribed medication with out relief.    Review of Systems  Positive: Abdominal pain, cough, body pain, back pain Negative: Vomiting and diarrhea.   Physical Exam  BP (!) 138/93 (BP Location: Left Arm)   Pulse 93   Temp 97.9 F (36.6 C) (Oral)   Resp 16   SpO2 97%  Gen:   Awake, no distress   Resp:  Normal effort  MSK:   Moves extremities without difficulty  Other:  Normal gait.   Medical Decision Making  Medically screening exam initiated at 12:47 PM.  Appropriate orders placed.  Kiya Eno was informed that the remainder of the evaluation will be completed by another provider, this initial triage assessment does not replace that evaluation, and the importance of remaining in the ED until their evaluation is complete.  CT renal stone study already ordered as patient is having hematuria and not on her cycle.  Will also order covid test given her multitude of symptoms.    Norman Clay 02/22/21 1252    Gloris Manchester, MD 02/22/21 2259

## 2021-02-22 NOTE — ED Triage Notes (Signed)
Patient is here with complaints of abdominal pain and pain her back. She has a headache and feels like she's having hot flashes. Nausea and vomiting. Hx: gallstones and stomach ulcers.

## 2021-05-17 ENCOUNTER — Emergency Department (HOSPITAL_COMMUNITY): Payer: Medicaid Other

## 2021-05-17 ENCOUNTER — Emergency Department (HOSPITAL_COMMUNITY)
Admission: EM | Admit: 2021-05-17 | Discharge: 2021-05-18 | Disposition: A | Discharge status: Home / Self Care | Payer: Medicaid Other | Attending: Emergency Medicine | Admitting: Emergency Medicine

## 2021-05-17 ENCOUNTER — Encounter (HOSPITAL_COMMUNITY): Payer: Self-pay

## 2021-05-17 ENCOUNTER — Other Ambulatory Visit: Payer: Self-pay

## 2021-05-17 DIAGNOSIS — Z5321 Procedure and treatment not carried out due to patient leaving prior to being seen by health care provider: Secondary | ICD-10-CM | POA: Insufficient documentation

## 2021-05-17 DIAGNOSIS — U071 COVID-19: Secondary | ICD-10-CM | POA: Insufficient documentation

## 2021-05-17 LAB — RESP PANEL BY RT-PCR (FLU A&B, COVID) ARPGX2
Influenza A by PCR: NEGATIVE
Influenza B by PCR: NEGATIVE
SARS Coronavirus 2 by RT PCR: POSITIVE — AB

## 2021-05-17 NOTE — ED Provider Notes (Signed)
Emergency Medicine Provider Triage Evaluation Note  Mary Molina , a 25 y.o. female  was evaluated in triage.  Pt complains of dyspnea on exertion that started earlier today.  Patient reports she has positive at home COVID test needed to come in to be retested.  She reports abdominal pain, nausea.  Denies chest pain or palpitations..  Review of Systems  Positive: As above Negative: As above  Physical Exam  BP 124/83 (BP Location: Left Arm)    Pulse 93    Temp 99 F (37.2 C) (Oral)    Resp 20    Ht 5\' 5"  (1.651 m)    Wt 90.7 kg    SpO2 98%    BMI 33.28 kg/m  Gen:   Awake, no distress   Resp:  Normal effort  MSK:   Moves extremities without difficulty  Other:    Medical Decision Making  Medically screening exam initiated at 9:30 PM.  Appropriate orders placed.  Mary Molina was informed that the remainder of the evaluation will be completed by another provider, this initial triage assessment does not replace that evaluation, and the importance of remaining in the ED until their evaluation is complete.     Mary Siad, PA-C 05/17/21 2131    2132, MD 05/20/21 [phone]

## 2021-05-17 NOTE — ED Triage Notes (Signed)
Pt here for nasal congestion and cough. Pt states that she was around family member who tested positive for COVID. Pt states that she is also having lower back that radiates up to her shoulders. Pt also stating she gets dyspnea with exertion. Pt also endorsed one episode of loose stools.

## 2021-05-18 NOTE — ED Notes (Signed)
No answer in the lobby 

## 2021-07-06 ENCOUNTER — Other Ambulatory Visit: Payer: Self-pay

## 2021-07-06 ENCOUNTER — Ambulatory Visit (INDEPENDENT_AMBULATORY_CARE_PROVIDER_SITE_OTHER): Payer: 59

## 2021-07-06 ENCOUNTER — Ambulatory Visit (HOSPITAL_COMMUNITY)
Admission: EM | Admit: 2021-07-06 | Discharge: 2021-07-06 | Disposition: A | Discharge status: Home / Self Care | Payer: 59 | Attending: Physician Assistant | Admitting: Physician Assistant

## 2021-07-06 ENCOUNTER — Encounter (HOSPITAL_COMMUNITY): Payer: Self-pay | Admitting: *Deleted

## 2021-07-06 DIAGNOSIS — J4521 Mild intermittent asthma with (acute) exacerbation: Secondary | ICD-10-CM

## 2021-07-06 DIAGNOSIS — R059 Cough, unspecified: Secondary | ICD-10-CM

## 2021-07-06 DIAGNOSIS — J189 Pneumonia, unspecified organism: Secondary | ICD-10-CM

## 2021-07-06 HISTORY — DX: Pneumonia, unspecified organism: J18.9

## 2021-07-06 MED ORDER — PREDNISONE 10 MG (21) PO TBPK: ORAL_TABLET | Freq: Every day | ORAL | 0 refills | Status: DC

## 2021-07-06 MED ORDER — ALBUTEROL SULFATE (2.5 MG/3ML) 0.083% IN NEBU: 2.5000 mg | INHALATION_SOLUTION | Freq: Every day | RESPIRATORY_TRACT | 1 refills | Status: DC | PRN

## 2021-07-06 MED ORDER — BENZONATATE 100 MG PO CAPS: 100.0000 mg | ORAL_CAPSULE | Freq: Three times a day (TID) | ORAL | 0 refills | Status: DC

## 2021-07-06 NOTE — Discharge Instructions (Signed)
Your x-ray was normal.  Please start prednisone taper.  I have called in albuterol to be used in your nebulizer.  Please use this or your inhaler every 4-6 hours as needed for shortness of breath and coughing symptoms.  Use Tessalon for cough.  Use Mucinex, Flonase, Tylenol for additional symptom relief.  Make sure you rest and drink plenty of fluid.  If symptoms do not improve by next week please return here see your PCP.  If you develop any severe symptoms such as chest pain, shortness of breath, high fever, worsening cough, nausea/vomiting, weakness you should be seen immediately.

## 2021-07-06 NOTE — ED Provider Notes (Signed)
[place]    CSN: [id number] Arrival date & time: 07/06/21  [place]      History   Chief Complaint Chief Complaint  Patient presents with   Asthma    HPI Mary Molina is a 26 y.o. female.   Patient presents today with a month-long history of intermittent coughing that has worsened significantly in the past several days.  She does have a history of asthma and has been using her albuterol inhaler last use approximately 1.5 hours ago.  She reports this provides some relief of symptoms but only temporarily and has been using this more often prompting evaluation today.  She reports cough is dry and she denies additional symptoms including nausea, vomiting, fever, nasal congestion, sore throat.  She does report hospitalization for asthma several years ago but denies previous intubation.  She denies any recent antibiotic or steroid use.  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  She is having difficulty with daily activities as result of symptoms.   Past Medical History:  Diagnosis Date   Asthma    Hypertension     There are no problems to display for this patient.   History reviewed. No pertinent surgical history.  OB History   No obstetric history on file.      Home Medications    Prior to Admission medications   Medication Sig Start Date End Date Taking? Authorizing Provider  albuterol (PROVENTIL) (2.5 MG/3ML) 0.083% nebulizer solution Take 3 mLs (2.5 mg total) by nebulization daily as needed. 07/06/21   Tyaire Odem, Derry Skill, PA-C  baclofen (LIORESAL) 10 MG tablet Take 10 mg by mouth 3 (three) times daily. 12/18/19   [provider]  benzonatate (TESSALON) 100 MG capsule Take 1 capsule (100 mg total) by mouth every 8 (eight) hours. 07/06/21   Yari Szeliga, Derry Skill, PA-C  diphenhydrAMINE (BENADRYL) 25 MG tablet Take 1 tablet (25 mg total) by mouth every 6 (six) hours as needed. 06/24/20   Tedd Sias, PA  EPINEPHrine  0.3 mg/0.3 mL IJ SOAJ injection Inject 0.3 mLs (0.3 mg total) into the muscle as needed for anaphylaxis. 12/26/19   Fatima Blank, MD  famotidine (PEPCID) 20 MG tablet Take 1 tablet (20 mg total) by mouth 2 (two) times daily. 06/24/20   Tedd Sias, PA  ibuprofen (ADVIL) 600 MG tablet Take 1 tablet (600 mg total) by mouth every 6 (six) hours as needed. 12/31/20   Charlann Lange, PA-C  methocarbamol (ROBAXIN) 500 MG tablet Take 1 tablet (500 mg total) by mouth 2 (two) times daily. 12/31/20   Charlann Lange, PA-C  ondansetron (ZOFRAN ODT) 4 MG disintegrating tablet Take 1 tablet (4 mg total) by mouth every 8 (eight) hours as needed for nausea or vomiting. 02/15/21   Sherrell Puller, PA-C  predniSONE (STERAPRED UNI-PAK 21 TAB) 10 MG (21) TBPK tablet Take by mouth daily. Take 6 tabs by mouth daily  for 2 days, then 5 tabs for 2 days, then 4 tabs for 2 days, then 3 tabs for 2 days, 2 tabs for 2 days, then 1 tab by mouth daily for 2 days 07/06/21   Weyman Bogdon, Derry Skill, PA-C    Family History History reviewed. No pertinent family history.  Social History Social History   Tobacco Use   Smoking status: Every Day    Types: Cigarettes   Smokeless tobacco: Never     Allergies   Banana, Pineapple, and Metronidazole  Review of Systems Review of Systems  Constitutional:  Negative for activity change, appetite change, fatigue and fever.  HENT:  Negative for congestion, sinus pressure, sneezing and sore throat.   Respiratory:  Positive for cough and shortness of breath.   Cardiovascular:  Negative for chest pain.  Gastrointestinal:  Negative for abdominal pain, diarrhea, nausea and vomiting.  Neurological:  Negative for dizziness, light-headedness and headaches.    Physical Exam Triage Vital Signs ED Triage Vitals  Enc Vitals Group     BP 07/06/21 1859 112/79     Pulse Rate 07/06/21 1859 (!) 101     Resp 07/06/21 1859 (!) 22     Temp 07/06/21 1859 98.2 F (36.8 C)     Temp src --       SpO2 07/06/21 1859 98 %     Weight --      Height --      Head Circumference --      Peak Flow --      Pain Score 07/06/21 1857 10     Pain Loc --      Pain Edu? --      Excl. in [place]? --    No data found.  Updated Vital Signs BP 112/79    Pulse (!) 101    Temp 98.2 F (36.8 C)    Resp (!) 22    LMP 06/22/2021    SpO2 98%   Visual Acuity Right Eye Distance:   Left Eye Distance:   Bilateral Distance:    Right Eye Near:   Left Eye Near:    Bilateral Near:     Physical Exam Vitals reviewed.  Constitutional:      General: She is awake. She is not in acute distress.    Appearance: Normal appearance. She is well-developed. She is not ill-appearing.     Comments: Very pleasant female appears stated age in no acute distress sitting comfortably in exam room  HENT:     Head: Normocephalic and atraumatic.     Right Ear: Tympanic membrane, ear canal and external ear normal. Tympanic membrane is not erythematous or bulging.     Left Ear: Tympanic membrane, ear canal and external ear normal. Tympanic membrane is not erythematous or bulging.     Nose:     Right Sinus: No maxillary sinus tenderness or frontal sinus tenderness.     Left Sinus: No maxillary sinus tenderness or frontal sinus tenderness.     Mouth/Throat:     Pharynx: Uvula midline. No oropharyngeal exudate or posterior oropharyngeal erythema.  Cardiovascular:     Rate and Rhythm: Normal rate and regular rhythm.     Heart sounds: Normal heart sounds, S1 normal and S2 normal. No murmur heard. Pulmonary:     Effort: Pulmonary effort is normal.     Breath sounds: Normal breath sounds. No wheezing, rhonchi or rales.     Comments: Clear to auscultation bilaterally Psychiatric:        Behavior: Behavior is cooperative.     UC Treatments / Results  Labs (all labs ordered are listed, but only abnormal results are displayed) Labs Reviewed - No data to display  EKG   Radiology DG Chest 2 View  Result Date:  07/06/2021 CLINICAL DATA:  Cough EXAM: CHEST - 2 VIEW COMPARISON:  Chest x-ray 05/17/2021 FINDINGS: Heart size and mediastinal contours are within normal limits. No suspicious pulmonary opacities identified. No pleural effusion or pneumothorax visualized. No acute osseous abnormality appreciated. IMPRESSION: No acute  intrathoracic process identified. Electronically Signed   By: Ofilia Neas M.D.   On: 07/06/2021 19:58    Procedures Procedures (including critical care time)  Medications Ordered in UC Medications - No data to display  Initial Impression / Assessment and Plan / UC Course  I have reviewed the triage vital signs and the nursing notes.  Pertinent labs & imaging results that were available during my care of the patient were reviewed by me and considered in my medical decision making (see chart for details).     No indication for viral testing given patient has been symptomatic for over a month.  Given prolonged cough x-ray obtained which showed no cardiopulmonary disease.  No evidence of acute infection on physical exam that warrant initiation of antibiotics.  Suspect asthma flare as etiology of symptoms.  Patient was started on prednisone taper with instruction not to take NSAIDs with this medication due to risk of GI bleeding.  She was provided a refill of Tessalon to be used for cough.  She reports having enough of her albuterol inhaler but is out of the nebulizer solution and so a refill of this was sent to the pharmacy.  Recommend she use albuterol every 4-6 hours as needed for shortness of breath and cough.  She can use Mucinex, Flonase, Tylenol for additional symptom relief.  Recommended that she rest and drink plenty of fluid.  Discussed that if she has any worsening symptoms including symptoms not responding to albuterol, worsening shortness of breath, chest pain, weakness, nausea/vomiting interfering with oral intake she needs to go to the emergency room.  Recommended follow-up  with either our clinic or primary care next week if symptoms or not resolved.  She was provided work excuse note.  Strict return precautions given to which she expressed understanding.  Final Clinical Impressions(s) / UC Diagnoses   Final diagnoses:  Mild intermittent asthma with exacerbation     Discharge Instructions      Your x-ray was normal.  Please start prednisone taper.  I have called in albuterol to be used in your nebulizer.  Please use this or your inhaler every 4-6 hours as needed for shortness of breath and coughing symptoms.  Use Tessalon for cough.  Use Mucinex, Flonase, Tylenol for additional symptom relief.  Make sure you rest and drink plenty of fluid.  If symptoms do not improve by next week please return here see your PCP.  If you develop any severe symptoms such as chest pain, shortness of breath, high fever, worsening cough, nausea/vomiting, weakness you should be seen immediately.     ED Prescriptions     Medication Sig Dispense Auth. Provider   albuterol (PROVENTIL) (2.5 MG/3ML) 0.083% nebulizer solution Take 3 mLs (2.5 mg total) by nebulization daily as needed. 75 mL Lear Carstens K, PA-C   predniSONE (STERAPRED UNI-PAK 21 TAB) 10 MG (21) TBPK tablet Take by mouth daily. Take 6 tabs by mouth daily  for 2 days, then 5 tabs for 2 days, then 4 tabs for 2 days, then 3 tabs for 2 days, 2 tabs for 2 days, then 1 tab by mouth daily for 2 days 42 tablet Natale Barba K, PA-C   benzonatate (TESSALON) 100 MG capsule Take 1 capsule (100 mg total) by mouth every 8 (eight) hours. 21 capsule Adriano Bischof K, PA-C      PDMP not reviewed this encounter.   Terrilee Croak, PA-C 07/06/21 2037

## 2021-07-06 NOTE — ED Triage Notes (Signed)
Pt reports being sick for a month off and on with [facility]. Pt also has a cough causing back pain.

## 2021-07-08 ENCOUNTER — Telehealth (HOSPITAL_COMMUNITY): Payer: Self-pay | Admitting: Family Medicine

## 2021-07-08 MED ORDER — ALBUTEROL SULFATE (2.5 MG/3ML) 0.083% IN NEBU: 2.5000 mg | INHALATION_SOLUTION | Freq: Every day | RESPIRATORY_TRACT | 1 refills | Status: DC | PRN

## 2021-07-08 NOTE — Telephone Encounter (Signed)
Rx printed for albuterol for her to take to different pharmacy

## 2021-07-17 ENCOUNTER — Other Ambulatory Visit: Payer: Self-pay

## 2021-07-17 ENCOUNTER — Encounter (HOSPITAL_COMMUNITY): Payer: Self-pay

## 2021-07-17 ENCOUNTER — Ambulatory Visit (HOSPITAL_COMMUNITY)
Admission: EM | Admit: 2021-07-17 | Discharge: 2021-07-17 | Disposition: A | Discharge status: Home / Self Care | Payer: 59 | Attending: Emergency Medicine | Admitting: Emergency Medicine

## 2021-07-17 DIAGNOSIS — J989 Respiratory disorder, unspecified: Secondary | ICD-10-CM

## 2021-07-17 DIAGNOSIS — T7840XA Allergy, unspecified, initial encounter: Secondary | ICD-10-CM | POA: Diagnosis not present

## 2021-07-17 DIAGNOSIS — J4521 Mild intermittent asthma with (acute) exacerbation: Secondary | ICD-10-CM

## 2021-07-17 MED ORDER — CETIRIZINE HCL 10 MG PO TABS: 10.0000 mg | ORAL_TABLET | Freq: Every day | ORAL | 2 refills | Status: AC

## 2021-07-17 MED ORDER — METHYLPREDNISOLONE SODIUM SUCC 125 MG IJ SOLR
INTRAMUSCULAR | Status: AC
  Filled 2021-07-17: qty 2

## 2021-07-17 MED ORDER — GUAIFENESIN 400 MG PO TABS
ORAL_TABLET | ORAL | 0 refills | Status: DC
Start: 2021-07-17 — End: 2021-08-25

## 2021-07-17 MED ORDER — ALBUTEROL SULFATE 1.25 MG/3ML IN NEBU: 1.0000 | INHALATION_SOLUTION | Freq: Four times a day (QID) | RESPIRATORY_TRACT | 0 refills | Status: AC | PRN

## 2021-07-17 MED ORDER — IPRATROPIUM BROMIDE 0.06 % NA SOLN: 2.0000 | Freq: Four times a day (QID) | NASAL | 0 refills | Status: AC

## 2021-07-17 MED ORDER — AZITHROMYCIN 250 MG PO TABS: ORAL_TABLET | ORAL | 0 refills | Status: AC

## 2021-07-17 MED ORDER — PROMETHAZINE-DM 6.25-15 MG/5ML PO SYRP: 5.0000 mL | ORAL_SOLUTION | Freq: Four times a day (QID) | ORAL | 0 refills | Status: DC | PRN

## 2021-07-17 MED ORDER — IBUPROFEN 400 MG PO TABS: 400.0000 mg | ORAL_TABLET | Freq: Three times a day (TID) | ORAL | 0 refills | Status: DC | PRN

## 2021-07-17 MED ORDER — METHYLPREDNISOLONE SODIUM SUCC 125 MG IJ SOLR
125.0000 mg | Freq: Once | INTRAMUSCULAR | Status: AC
  Administered 2021-07-17: 125 mg via INTRAMUSCULAR

## 2021-07-17 NOTE — Discharge Instructions (Addendum)
Your symptoms and my physical exam findings are concerning for exacerbation of your underlying allergies.  It is important that you are consistent with taking allergy medications exactly as prescribed.  Not doing so increases your risk of more frequent upper respiratory infections that may or may not require the use of antibiotics, serious exacerbations that require the use of oral steroids, loss of time at work and missed social opportunities.   Your symptoms and my physical exam findings are concerning for exacerbation of your underlying asthma.  Is important that you are consistent with taking your asthma medications exactly as prescribed.  Not doing so increases your risk of more frequent upper respiratory infections that may or may not require the use of antibiotics, more serious asthma exacerbations that may require the use of oral steroids, also time at work and missed social opportunities.  You also significantly increase your risk of hospitalization secondary to respiratory infections.                     Please see the list below for recommended medications, dosages and frequencies to provide relief of your current symptoms:     Azithromycin (Z-Pak):  take 2 tablets the first day, then take 1 tablet daily every day thereafter until complete.   Methylprednisolone IM (Solu-Medrol):  To quickly address your significant respiratory inflammation, you were provided with an injection of methylprednisolone in the office today.  You should continue to feel the full benefit of the steroid for the next 4 to 6 hours.    Albuterol HFA: This is a bronchodilator, it relaxes the smooth muscles that constrict your airway in your lungs when you are feeling sick or having inflammation secondary to allergies or upper respiratory infection.  Please inhale 2 puffs twice daily every day using the spacer provided.  You can also inhale 2 more puffs as often as needed throughout the day for aggravating cough, chest  tightness, feeling short of breath, wheezing.      Ibuprofen  (Advil, Motrin): This is a good anti-inflammatory medication which addresses aches, pains and inflammation of the upper airways that causes sinus and nasal congestion as well as in the lower airways which makes your cough feel tight and sometimes burn.  I recommend that you take between 400 to 600 mg every 6-8 hours as needed, I have provided you with a prescription for 400 mg.      Cetirizine (Zyrtec): This is an excellent second-generation antihistamine that helps to reduce respiratory inflammatory response to viruses and environmental allergens.  Please take 1 tablet daily at bedtime.   Ipratropium (Atrovent): This is an excellent nasal decongestant spray that does not cause rebound congestion, can be used up to 4 times daily as needed, instill 2 sprays into each nare with each use.  I have provided you with a prescription for this medication.      Guaifenesin (Robitussin, Mucinex): This is an expectorant.  This helps break up chest congestion and loosen up thick nasal drainage making phlegm and drainage more liquid and therefore easier to remove.  I recommend being 400 mg three times daily as needed.      Promethazine DM: Promethazine is both a nasal decongestant and an antinausea medication that makes most patients feel fairly sleepy.  The DM is dextromethorphan, a cough suppressant found in many over-the-counter cough medications.  Please take 5 mL before bedtime to minimize your cough which will help you sleep better.  I have provided you with  a prescription for this medication.      Conservative care is also recommended at this time.  This includes rest, pushing clear fluids and activity as tolerated.  Warm beverages such as teas and broths versus cold beverages/popsicles and frozen sherbet/sorbet are your choice, both warm and cold are beneficial.  You may also notice that your appetite is reduced; this is okay as long as you are  drinking plenty of clear fluids.    Please follow-up within the next 3 to 5 days either with your primary care provider or urgent care if your symptoms do not resolve.  If you do not have a primary care provider, we will assist you in finding one.

## 2021-07-17 NOTE — ED Triage Notes (Signed)
Pt presents with c/o sore throat and jaw pain.   States she has chest and upper back pain X 2 weeks.

## 2021-07-17 NOTE — ED Provider Notes (Signed)
[facility]    CSN: [id number] Arrival date & time: 07/17/21  1416    HISTORY   Chief Complaint  Patient presents with   Sore Throat   HPI Mary Molina is a 26 y.o. female. Patient complains of sore throat and jaw pain along with pain in her upper back and chest from coughing.  Patient is afebrile on arrival with normal oxygenation, normal respirations, normal blood pressure and normal heart rate.  Patient states she was seen here at urgent care at 11 days ago, states she was advised that she was in the middle of an asthma exacerbation and was prescribed a 6-day course of prednisone and advised to continue using albuterol neb treatments.  Patient states that as of today, she still has 2 more days of the prednisone which leads me to believe that she has not been taking it as prescribed or waited several days before she began taking it.  She adds that she began to have significant hoarseness of her voice about 4 days after she started taking prednisone, developed significant runny nose and left otalgia.  Patient states she is continue to use albuterol nebs 4 times daily since she was last seen, per EMR patient was only provided with 15 respules so it is unclear to me how she has been able to use this 4 times daily for the last 11 days.  Patient is requesting notes for work and school.  The history is provided by the patient.  Past Medical History:  Diagnosis Date   Asthma    Hypertension    There are no problems to display for this patient.  History reviewed. No pertinent surgical history. OB History   No obstetric history on file.    Home Medications    Prior to Admission medications   Medication Sig Start Date End Date Taking? Authorizing Provider  EPINEPHrine 0.3 mg/0.3 mL IJ SOAJ injection Inject 0.3 mLs (0.3 mg total) into the muscle as needed for anaphylaxis. 12/26/19   Nira Conn, MD   Family History History reviewed. No pertinent family  history. Social History Social History   Tobacco Use   Smoking status: Every Day    Types: Cigarettes   Smokeless tobacco: Never   Allergies   Banana, Pineapple, and Metronidazole  Review of Systems Review of Systems Pertinent findings noted in history of present illness.   Physical Exam Triage Vital Signs ED Triage Vitals  Enc Vitals Group     BP 04/01/21 0827 (!) 147/82     Pulse Rate 04/01/21 0827 72     Resp 04/01/21 0827 18     Temp 04/01/21 0827 98.3 F (36.8 C)     Temp Source 04/01/21 0827 Oral     SpO2 04/01/21 0827 98 %     Weight --      Height --      Head Circumference --      Peak Flow --      Pain Score 04/01/21 0826 5     Pain Loc --      Pain Edu? --      Excl. in GC? --   No data found.  Updated Vital Signs BP 129/85 (BP Location: Right Arm)    Pulse 78    Temp 97.8 F (36.6 C) (Oral)    Resp 19    LMP 06/22/2021    SpO2 98%   Physical Exam Vitals and nursing note reviewed.  Constitutional:      General:  She is not in acute distress.    Appearance: Normal appearance. She is not ill-appearing.  HENT:     Head: Normocephalic and atraumatic.     Salivary Glands: Right salivary gland is not diffusely enlarged or tender. Left salivary gland is not diffusely enlarged or tender.     Right Ear: Hearing, tympanic membrane, ear canal and external ear normal. No drainage. No middle ear effusion. There is no impacted cerumen. Tympanic membrane is not erythematous or bulging.     Left Ear: Hearing, tympanic membrane, ear canal and external ear normal. No drainage.  No middle ear effusion. There is no impacted cerumen. Tympanic membrane is not erythematous or bulging.     Nose: Mucosal edema, congestion and rhinorrhea present. No nasal deformity or septal deviation. Rhinorrhea is clear.     Right Nostril: No foreign body, epistaxis, septal hematoma or occlusion.     Left Nostril: No foreign body, epistaxis, septal hematoma or occlusion.     Right Turbinates:  Enlarged and swollen. Not pale.     Left Turbinates: Enlarged and swollen. Not pale.     Right Sinus: No maxillary sinus tenderness or frontal sinus tenderness.     Left Sinus: No maxillary sinus tenderness or frontal sinus tenderness.     Mouth/Throat:     Lips: Pink. No lesions.     Mouth: Mucous membranes are moist. No oral lesions.     Pharynx: Oropharynx is clear. Uvula midline. No posterior oropharyngeal erythema or uvula swelling.     Tonsils: No tonsillar exudate. 0 on the right. 0 on the left.  Eyes:     General: Lids are normal.        Right eye: No discharge.        Left eye: No discharge.     Extraocular Movements: Extraocular movements intact.     Conjunctiva/sclera: Conjunctivae normal.     Right eye: Right conjunctiva is not injected.     Left eye: Left conjunctiva is not injected.  Neck:     Trachea: Trachea and phonation normal. No tracheal tenderness.  Cardiovascular:     Rate and Rhythm: Normal rate and regular rhythm.     Pulses: Normal pulses.     Heart sounds: Normal heart sounds. No murmur heard.   No friction rub. No gallop.  Pulmonary:     Effort: Pulmonary effort is normal. Prolonged expiration present. No tachypnea, bradypnea, accessory muscle usage or respiratory distress.     Breath sounds: No stridor, decreased air movement or transmitted upper airway sounds. Examination of the right-lower field reveals decreased breath sounds. Examination of the left-lower field reveals decreased breath sounds. Decreased breath sounds present. No wheezing, rhonchi or rales.  Chest:     Chest wall: No tenderness.  Abdominal:     General: Abdomen is flat. Bowel sounds are normal.     Palpations: Abdomen is soft.     Tenderness: There is no abdominal tenderness.     Hernia: No hernia is present.  Musculoskeletal:        General: Normal range of motion.     Cervical back: Normal range of motion and neck supple. No edema, erythema, rigidity or crepitus. No pain with  movement. Normal range of motion.     Right lower leg: No edema.     Left lower leg: No edema.  Lymphadenopathy:     Cervical: No cervical adenopathy.  Skin:    General: Skin is warm and dry.  Findings: No erythema or rash.     Comments: Skin is warm and dry to touch, good turgor, no rash appreciated  Neurological:     General: No focal deficit present.     Mental Status: She is alert and oriented to person, place, and time.     Motor: Motor function is intact.     Coordination: Coordination is intact.     Gait: Gait is intact.     Deep Tendon Reflexes:     Reflex Scores:      Patellar reflexes are 2+ on the right side and 2+ on the left side. Psychiatric:        Attention and Perception: Attention and perception normal.        Mood and Affect: Mood normal.        Speech: Speech normal.        Behavior: Behavior normal. Behavior is cooperative.        Thought Content: Thought content normal.        Cognition and Memory: Cognition normal.        Judgment: Judgment normal.    Visual Acuity Right Eye Distance:   Left Eye Distance:   Bilateral Distance:    Right Eye Near:   Left Eye Near:    Bilateral Near:     UC Couse / Diagnostics / Procedures:    EKG  Radiology No results found.  Procedures Procedures (including critical care time)  UC Diagnoses / Final Clinical Impressions(s)   I have reviewed the triage vital signs and the nursing notes.  Pertinent labs & imaging results that were available during my care of the patient were reviewed by me and considered in my medical decision making (see chart for details).   Final diagnoses:  Mild intermittent asthma with (acute) exacerbation  Allergic disorder of respiratory system, initial encounter   Patient advised to begin azithromycin due to prolonged respiratory illness, methylprednisolone injection provided during visit today.  Patient also provided with renewal of albuterol.  Supportive medications prescribed.   Patient advised to begin taking Zyrtec given history of asthma and allergies.  ED Prescriptions     Medication Sig Dispense Auth. Provider   azithromycin (ZITHROMAX) 250 MG tablet Take 2 tablets (500 mg total) by mouth daily for 1 day, THEN 1 tablet (250 mg total) daily for 4 days. 6 tablet Theadora RamaMorgan, Elah Avellino Scales, PA-C   promethazine-dextromethorphan (PROMETHAZINE-DM) 6.25-15 MG/5ML syrup Take 5 mLs by mouth 4 (four) times daily as needed for cough. 180 mL Theadora RamaMorgan, Reeya Bound Scales, PA-C   guaifenesin (HUMIBID E) 400 MG TABS tablet Take 1 tablet 3 times daily as needed for chest congestion and cough 21 tablet Theadora RamaMorgan, Althea Backs Scales, PA-C   ipratropium (ATROVENT) 0.06 % nasal spray Place 2 sprays into both nostrils 4 (four) times daily. As needed for nasal congestion, runny nose 15 mL Theadora RamaMorgan, Clearence Vitug Scales, PA-C   ibuprofen (ADVIL) 400 MG tablet Take 1 tablet (400 mg total) by mouth every 8 (eight) hours as needed for up to 30 doses. 30 tablet Theadora RamaMorgan, Lynia Landry Scales, PA-C   cetirizine (ZYRTEC ALLERGY) 10 MG tablet Take 1 tablet (10 mg total) by mouth at bedtime. 30 tablet Theadora RamaMorgan, Rishaan Gunner Scales, PA-C   albuterol (ACCUNEB) 1.25 MG/3ML nebulizer solution Take 3 mLs (1.25 mg total) by nebulization every 6 (six) hours as needed for wheezing. 360 mL Theadora RamaMorgan, Himani Corona Scales, PA-C      PDMP not reviewed this encounter.  Pending results:  Labs Reviewed - No data to  display  Medications Ordered in UC: Medications  methylPREDNISolone sodium succinate (SOLU-MEDROL) 125 mg/2 mL injection 125 mg (has no administration in time range)    Disposition Upon Discharge:  Condition: stable for discharge home Home: take medications as prescribed; routine discharge instructions as discussed; follow up as advised.  Patient presented with an acute illness with associated systemic symptoms and significant discomfort requiring urgent management. In my opinion, this is a condition that a prudent lay person (someone who  possesses an average knowledge of health and medicine) may potentially expect to result in complications if not addressed urgently such as respiratory distress, impairment of bodily function or dysfunction of bodily organs.   Routine symptom specific, illness specific and/or disease specific instructions were discussed with the patient and/or caregiver at length.   As such, the patient has been evaluated and assessed, work-up was performed and treatment was provided in alignment with urgent care protocols and evidence based medicine.  Patient/parent/caregiver has been advised that the patient may require follow up for further testing and treatment if the symptoms continue in spite of treatment, as clinically indicated and appropriate.  If the patient was tested for COVID-19, Influenza and/or RSV, then the patient/parent/guardian was advised to isolate at home pending the results of his/her diagnostic coronavirus test and potentially longer if theyre positive. I have also advised pt that if his/her COVID-19 test returns positive, it's recommended to self-isolate for at least 10 days after symptoms first appeared AND until fever-free for 24 hours without fever reducer AND other symptoms have improved or resolved. Discussed self-isolation recommendations as well as instructions for household member/close contacts as per the [facility] and [place] DHHS, and also gave patient the COVID packet with this information.  Patient/parent/caregiver has been advised [facility] in 3-5 days if no better; to PCP or the Emergency Department if new signs and symptoms develop, or if the current signs or symptoms continue to change or worsen for further workup, evaluation and treatment as clinically indicated and appropriate  The patient will follow up with their current PCP if and as advised. If the patient does not currently have a PCP we will assist them in obtaining one.   The patient may need specialty follow up  if the symptoms continue, in spite of conservative treatment and management, for further workup, evaluation, consultation and treatment as clinically indicated and appropriate.  Patient/parent/caregiver verbalized understanding and agreement of plan as discussed.  All questions were addressed during visit.  Please see discharge instructions below for further details of plan.  Discharge Instructions:   Discharge Instructions      Your symptoms and my physical exam findings are concerning for exacerbation of your underlying allergies.  It is important that you are consistent with taking allergy medications exactly as prescribed.  Not doing so increases your risk of more frequent upper respiratory infections that may or may not require the use of antibiotics, serious exacerbations that require the use of oral steroids, loss of time at work and missed social opportunities.   Your symptoms and my physical exam findings are concerning for exacerbation of your underlying asthma.  Is important that you are consistent with taking your asthma medications exactly as prescribed.  Not doing so increases your risk of more frequent upper respiratory infections that may or may not require the use of antibiotics, more serious asthma exacerbations that may require the use of oral steroids, also time at work and missed social opportunities.  You also  significantly increase your risk of hospitalization secondary to respiratory infections.                     Please see the list below for recommended medications, dosages and frequencies to provide relief of your current symptoms:     Azithromycin (Z-Pak):  take 2 tablets the first day, then take 1 tablet daily every day thereafter until complete.   Methylprednisolone IM (Solu-Medrol):  To quickly address your significant respiratory inflammation, you were provided with an injection of methylprednisolone in the office today.  You should continue to feel the full benefit  of the steroid for the next 4 to 6 hours.    Albuterol HFA: This is a bronchodilator, it relaxes the smooth muscles that constrict your airway in your lungs when you are feeling sick or having inflammation secondary to allergies or upper respiratory infection.  Please inhale 2 puffs twice daily every day using the spacer provided.  You can also inhale 2 more puffs as often as needed throughout the day for aggravating cough, chest tightness, feeling short of breath, wheezing.      Ibuprofen  (Advil, Motrin): This is a good anti-inflammatory medication which addresses aches, pains and inflammation of the upper airways that causes sinus and nasal congestion as well as in the lower airways which makes your cough feel tight and sometimes burn.  I recommend that you take between 400 to 600 mg every 6-8 hours as needed, I have provided you with a prescription for 400 mg.      Cetirizine (Zyrtec): This is an excellent second-generation antihistamine that helps to reduce respiratory inflammatory response to viruses and environmental allergens.  Please take 1 tablet daily at bedtime.   Ipratropium (Atrovent): This is an excellent nasal decongestant spray that does not cause rebound congestion, can be used up to 4 times daily as needed, instill 2 sprays into each nare with each use.  I have provided you with a prescription for this medication.      Guaifenesin (Robitussin, Mucinex): This is an expectorant.  This helps break up chest congestion and loosen up thick nasal drainage making phlegm and drainage more liquid and therefore easier to remove.  I recommend being 400 mg three times daily as needed.      Promethazine DM: Promethazine is both a nasal decongestant and an antinausea medication that makes most patients feel fairly sleepy.  The DM is dextromethorphan, a cough suppressant found in many over-the-counter cough medications.  Please take 5 mL before bedtime to minimize your cough which will help you sleep  better.  I have provided you with a prescription for this medication.      Conservative care is also recommended at this time.  This includes rest, pushing clear fluids and activity as tolerated.  Warm beverages such as teas and broths versus cold beverages/popsicles and frozen sherbet/sorbet are your choice, both warm and cold are beneficial.  You may also notice that your appetite is reduced; this is okay as long as you are drinking plenty of clear fluids.    Please follow-up within the next 3 to 5 days either with your primary care provider or urgent care if your symptoms do not resolve.  If you do not have a primary care provider, we will assist you in finding one.     This office note has been dictated using Teaching laboratory technician.  Unfortunately, and despite my best efforts, this method of dictation can sometimes lead  to occasional typographical or grammatical errors.  I apologize in advance if this occurs.     Theadora Rama Scales, PA-C 07/17/21 1654

## 2021-08-05 ENCOUNTER — Emergency Department (HOSPITAL_COMMUNITY)
Admission: EM | Admit: 2021-08-05 | Discharge: 2021-08-05 | Disposition: A | Discharge status: Home / Self Care | Payer: 59 | Attending: Emergency Medicine | Admitting: Emergency Medicine

## 2021-08-05 ENCOUNTER — Emergency Department (HOSPITAL_COMMUNITY): Payer: 59

## 2021-08-05 ENCOUNTER — Other Ambulatory Visit: Payer: Self-pay

## 2021-08-05 ENCOUNTER — Encounter (HOSPITAL_COMMUNITY): Payer: Self-pay | Admitting: Emergency Medicine

## 2021-08-05 DIAGNOSIS — R1011 Right upper quadrant pain: Secondary | ICD-10-CM | POA: Diagnosis present

## 2021-08-05 DIAGNOSIS — K8020 Calculus of gallbladder without cholecystitis without obstruction: Secondary | ICD-10-CM | POA: Diagnosis not present

## 2021-08-05 DIAGNOSIS — K8050 Calculus of bile duct without cholangitis or cholecystitis without obstruction: Secondary | ICD-10-CM

## 2021-08-05 LAB — CBC WITH DIFFERENTIAL/PLATELET
Abs Immature Granulocytes: 0.02 10*3/uL (ref 0.00–0.07)
Basophils Absolute: 0.1 10*3/uL (ref 0.0–0.1)
Basophils Relative: 1 %
Eosinophils Absolute: 0.3 10*3/uL (ref 0.0–0.5)
Eosinophils Relative: 3 %
HCT: 39.4 % (ref 36.0–46.0)
Hemoglobin: 13 g/dL (ref 12.0–15.0)
Immature Granulocytes: 0 %
Lymphocytes Relative: 47 %
Lymphs Abs: 4.2 10*3/uL — ABNORMAL HIGH (ref 0.7–4.0)
MCH: 29.6 pg (ref 26.0–34.0)
MCHC: 33 g/dL (ref 30.0–36.0)
MCV: 89.7 fL (ref 80.0–100.0)
Monocytes Absolute: 0.4 10*3/uL (ref 0.1–1.0)
Monocytes Relative: 5 %
Neutro Abs: 3.9 10*3/uL (ref 1.7–7.7)
Neutrophils Relative %: 44 %
Platelets: 314 10*3/uL (ref 150–400)
RBC: 4.39 MIL/uL (ref 3.87–5.11)
RDW: 12.6 % (ref 11.5–15.5)
WBC: 8.9 10*3/uL (ref 4.0–10.5)
nRBC: 0 % (ref 0.0–0.2)

## 2021-08-05 LAB — URINALYSIS, ROUTINE W REFLEX MICROSCOPIC
Bilirubin Urine: NEGATIVE
Glucose, UA: NEGATIVE mg/dL
Ketones, ur: NEGATIVE mg/dL
Leukocytes,Ua: NEGATIVE
Nitrite: NEGATIVE
Protein, ur: NEGATIVE mg/dL
Specific Gravity, Urine: 1.015 (ref 1.005–1.030)
pH: 7 (ref 5.0–8.0)

## 2021-08-05 LAB — LIPASE, BLOOD: Lipase: 38 U/L (ref 11–51)

## 2021-08-05 LAB — COMPREHENSIVE METABOLIC PANEL
ALT: 13 U/L (ref 0–44)
AST: 13 U/L — ABNORMAL LOW (ref 15–41)
Albumin: 3.8 g/dL (ref 3.5–5.0)
Alkaline Phosphatase: 59 U/L (ref 38–126)
Anion gap: 9 (ref 5–15)
BUN: 11 mg/dL (ref 6–20)
CO2: 24 mmol/L (ref 22–32)
Calcium: 9 mg/dL (ref 8.9–10.3)
Chloride: 105 mmol/L (ref 98–111)
Creatinine, Ser: 0.62 mg/dL (ref 0.44–1.00)
GFR, Estimated: >60 mL/min (ref >60)
Glucose, Bld: 127 mg/dL — ABNORMAL HIGH (ref 70–99)
Potassium: 4 mmol/L (ref 3.5–5.1)
Sodium: 138 mmol/L (ref 135–145)
Total Bilirubin: 0.5 mg/dL (ref 0.3–1.2)
Total Protein: 6.6 g/dL (ref 6.5–8.1)

## 2021-08-05 LAB — I-STAT BETA HCG BLOOD, ED (MC, WL, AP ONLY): I-stat hCG, quantitative: <5 m[IU]/mL (ref <5)

## 2021-08-05 MED ORDER — OXYCODONE-ACETAMINOPHEN 5-325 MG PO TABS
1.0000 | ORAL_TABLET | Freq: Four times a day (QID) | ORAL | 0 refills | Status: DC | PRN
Start: 2021-08-05 — End: 2021-08-25

## 2021-08-05 MED ORDER — ONDANSETRON 4 MG PO TBDP
4.0000 mg | ORAL_TABLET | Freq: Once | ORAL | Status: AC
  Administered 2021-08-05: 4 mg via ORAL
  Filled 2021-08-05: qty 1

## 2021-08-05 MED ORDER — ONDANSETRON 4 MG PO TBDP
4.0000 mg | ORAL_TABLET | Freq: Three times a day (TID) | ORAL | 0 refills | Status: AC | PRN
Start: 2021-08-05 — End: ?

## 2021-08-05 NOTE — ED Provider Notes (Signed)
? ?[facility] EMERGENCY DEPARTMENT  ?Provider Note ? ?CSN: [id number] ?Arrival date & time: 08/05/21 0325 ? ?History ?Chief Complaint  ?Patient presents with  ? Abdominal Pain  ? ? ?Mary Molina is a 26 y.o. female with known history of gall stones and suspected (but not confirmed) history of PUD reports sudden onset of RUQ abdominal pain during the night, associated with nausea but no vomiting. She occasionally has diarrhea but no melena. No fevers. She saw a Careers adviser for same a few years ago but could not afford cholecystectomy. She has insurance now.  ? ? ?Home Medications ?Prior to Admission medications   ?Medication Sig Start Date End Date Taking? Authorizing Provider  ?ondansetron (ZOFRAN-ODT) 4 MG disintegrating tablet Take 1 tablet (4 mg total) by mouth every 8 (eight) hours as needed for nausea or vomiting. 08/05/21  Yes Pollyann Savoy, MD  ?oxyCODONE-acetaminophen (PERCOCET/ROXICET) 5-325 MG tablet Take 1 tablet by mouth every 6 (six) hours as needed for severe pain. 08/05/21  Yes Pollyann Savoy, MD  ?albuterol (ACCUNEB) 1.25 MG/3ML nebulizer solution Take 3 mLs (1.25 mg total) by nebulization every 6 (six) hours as needed for wheezing. 07/17/21 08/16/21  Theadora Rama Scales, PA-C  ?cetirizine (ZYRTEC ALLERGY) 10 MG tablet Take 1 tablet (10 mg total) by mouth at bedtime. 07/17/21 10/15/21  Theadora Rama Scales, PA-C  ?EPINEPHrine 0.3 mg/0.3 mL IJ SOAJ injection Inject 0.3 mLs (0.3 mg total) into the muscle as needed for anaphylaxis. 12/26/19   Nira Conn, MD  ?guaifenesin (HUMIBID E) 400 MG TABS tablet Take 1 tablet 3 times daily as needed for chest congestion and cough 07/17/21   Theadora Rama Scales, PA-C  ?ibuprofen (ADVIL) 400 MG tablet Take 1 tablet (400 mg total) by mouth every 8 (eight) hours as needed for up to 30 doses. 07/17/21   Theadora Rama Scales, PA-C  ?ipratropium (ATROVENT) 0.06 % nasal spray Place 2 sprays into both nostrils 4 (four) times daily. As needed  for nasal congestion, runny nose 07/17/21   Theadora Rama Scales, PA-C  ?promethazine-dextromethorphan (PROMETHAZINE-DM) 6.25-15 MG/5ML syrup Take 5 mLs by mouth 4 (four) times daily as needed for cough. 07/17/21   Theadora Rama Scales, PA-C  ? ? ? ?Allergies    ?Banana, Pineapple, and Metronidazole ? ? ?Review of Systems   ?Review of Systems ?Please see HPI for pertinent positives and negatives ? ?Physical Exam ?BP 129/89 (BP Location: Right Arm)   Pulse 96   Temp 98 ?F (36.7 ?C) (Oral)   Resp 16   SpO2 96%  ? ?Physical Exam ?Vitals and nursing note reviewed.  ?Constitutional:   ?   Appearance: Normal appearance.  ?HENT:  ?   Head: Normocephalic and atraumatic.  ?   Nose: Nose normal.  ?   Mouth/Throat:  ?   Mouth: Mucous membranes are moist.  ?Eyes:  ?   Extraocular Movements: Extraocular movements intact.  ?   Conjunctiva/sclera: Conjunctivae normal.  ?Cardiovascular:  ?   Rate and Rhythm: Normal rate.  ?Pulmonary:  ?   Effort: Pulmonary effort is normal.  ?   Breath sounds: Normal breath sounds.  ?Abdominal:  ?   General: Abdomen is flat.  ?   Palpations: Abdomen is soft.  ?   Tenderness: There is abdominal tenderness in the right upper quadrant. There is no guarding. Negative signs include Murphy's sign and McBurney's sign.  ?Musculoskeletal:     ?   General: No swelling. Normal range of motion.  ?  Cervical back: Neck supple.  ?Skin: ?   General: Skin is warm and dry.  ?Neurological:  ?   General: No focal deficit present.  ?   Mental Status: She is alert.  ?Psychiatric:     ?   Mood and Affect: Mood normal.  ? ? ?ED Results / Procedures / Treatments   ?EKG ?EKG Interpretation ? ?Date/Time:  Friday August 05 2021 04:07:26 EST ?Ventricular Rate:  99 ?PR Interval:  144 ?QRS Duration: 84 ?QT Interval:  342 ?QTC Calculation: 438 ?R Axis:   98 ?Text Interpretation: Sinus rhythm with Premature atrial complexes Rightward axis Borderline ECG When compared with ECG of 17-May-2021 21:33, No significant change  since last tracing Confirmed by Susy Frizzle [phone]) on 08/05/2021 9:14:41 AM ? ?Procedures ?Procedures ? ?Medications Ordered in the ED ?Medications  ?ondansetron (ZOFRAN-ODT) disintegrating tablet 4 mg (has no administration in time range)  ? ? ?Initial Impression and Plan ? Patient with known cholelithiasis here with RUQ pain and nausea. Exam is benign. She had labs done in triage with normal CBC, BMP and lipase. UA was clear. I personally viewed the images from radiology studies and agree with radiologist interpretation: US showed gallstones with one in the neck of the gall bladder but no signs of infection or obstruction.  ? ?She will be given Rx for pain/nausea medications and referral to [facility] for evaluation and consideration of elective cholecystectomy. She was advised to return to the ER for worsening pain, fevers, intractable vomiting or any other concerns.  ? ?ED Course  ? ?  ? ? ?MDM Rules/Calculators/A&P ?Medical Decision Making ?Given presenting complaint, I considered that admission might be necessary. After review of results from ED lab and/or imaging studies, admission to the hospital is not indicated at this time.  ? ? ?Amount and/or Complexity of Data Reviewed ?External Data Reviewed: radiology and notes. ?   Details: Prior visits here and at [facility] for similar ?Labs: ordered. Decision-making details documented in ED Course. ?Radiology: ordered. Decision-making details documented in ED Course. ? ?Risk ?Prescription drug management. ? ? ? ?Final Clinical Impression(s) / ED Diagnoses ?Final diagnoses:  ?RUQ abdominal pain  ?Biliary colic  ? ? ?Rx / DC Orders ?ED Discharge Orders   ? ?      Ordered  ?  oxyCODONE-acetaminophen (PERCOCET/ROXICET) 5-325 MG tablet  Every 6 hours PRN       ? 08/05/21 0913  ?  ondansetron (ZOFRAN-ODT) 4 MG disintegrating tablet  Every 8 hours PRN       ? 08/05/21 0913  ? ?  ?  ? ?  ? ?  ?Pollyann Savoy, MD ?08/05/21 [phone] ? ?

## 2021-08-05 NOTE — ED Triage Notes (Signed)
Pt reported to ED with c/o right sided abdominal and chest pain that started around 0230 this AM. Pt states that she is having pain that radiates into back accompanied with nausea and diarrhea. Has hx of gallstones and ulcers. Endorses some shortness of breath.  ?

## 2021-08-05 NOTE — ED Provider Triage Note (Signed)
Emergency Medicine Provider Triage Evaluation Note ? ?Collie Siad , a 26 y.o. female  was evaluated in triage.  Pt complains of upper abdominal pain, this episode started suddenly at 2:30 in the morning.  She reports pain across the upper abdomen, worse in the right upper quadrant associated with nausea and one episode of diarrhea.  Some chills but no fever.  Reports pain radiates back to her back and up into her chest and feels similar to when she has had flareups from her ulcers before, also has known history of gallstones.. ? ?Review of Systems  ?Positive: Abdominal pain, nausea, chills, diarrhea ?Negative: Fever, vomiting ? ?Physical Exam  ?BP (!) 146/101 (BP Location: Right Arm)   Pulse 90   Temp 98.5 ?F (36.9 ?C) (Oral)   Resp 16   SpO2 95%  ?Gen:   Awake, no distress   ?Resp:  Normal effort  ?MSK:   Moves extremities without difficulty  ?Other:  Tenderness across upper abdomen, worse in RUQ and epigastric region ? ?Medical Decision Making  ?Medically screening exam initiated at 3:44 AM.  Appropriate orders placed.  Maleiyah Releford was informed that the remainder of the evaluation will be completed by another provider, this initial triage assessment does not replace that evaluation, and the importance of remaining in the ED until their evaluation is complete. ? ? ?  ?Dartha Lodge, PA-C ?08/05/21 7026 ? ?

## 2021-08-23 ENCOUNTER — Ambulatory Visit (HOSPITAL_COMMUNITY)
Admission: EM | Admit: 2021-08-23 | Discharge: 2021-08-23 | Disposition: A | Discharge status: Home / Self Care | Payer: 59 | Attending: Student | Admitting: Student

## 2021-08-23 ENCOUNTER — Encounter (HOSPITAL_COMMUNITY): Payer: Self-pay | Admitting: Emergency Medicine

## 2021-08-23 ENCOUNTER — Ambulatory Visit (INDEPENDENT_AMBULATORY_CARE_PROVIDER_SITE_OTHER): Payer: 59

## 2021-08-23 DIAGNOSIS — M25561 Pain in right knee: Secondary | ICD-10-CM | POA: Diagnosis not present

## 2021-08-23 DIAGNOSIS — S8001XA Contusion of right knee, initial encounter: Secondary | ICD-10-CM | POA: Diagnosis not present

## 2021-08-23 MED ORDER — KETOROLAC TROMETHAMINE 10 MG PO TABS: 10.0000 mg | ORAL_TABLET | Freq: Four times a day (QID) | ORAL | 0 refills | Status: AC | PRN

## 2021-08-23 NOTE — Discharge Instructions (Addendum)
-  Toradol (Ketorolac), one pill up to every 6 hours for pain. Make sure to take this with food. Avoid other NSAIDs like ibuprofen, alleve, naproxen while taking this medication.  ?-You can also take Tylenol 1000mg  up to 3x daily ?-Rest, ice, tylenol/ibuprofen ?-Knee brace while standing and walking while pain persists ?-If symptoms persist in 5-7 days, follow-up with an orthopedist. I recommend [facility] at [address] [place], [place]. You can schedule an appointment by calling [phone]) or online [phone]), but they also have a walk-in clinic M-F 8a-8p and Sat 10a-3p. ? ?

## 2021-08-23 NOTE — ED Provider Notes (Signed)
?[facility] ? ? ? ?CSN: [id number] ?Arrival date & time: 08/23/21  1321 ? ? ?  ? ?History   ?Chief Complaint ?Chief Complaint  ?Patient presents with  ? Knee Pain  ? ? ?HPI ?Mary Molina is a 26 y.o. female presenting with right knee pain.  Self-reported history of distant trauma to the knee as a child.  Patient states about 20 years ago, she tripped over a ball and landed directly onto the patella sustaining a large laceration, you could see bone through the laceration.  This eventually healed on its own, but she has had issues with the knee since then.  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  Now with significant pain over the patella, worse with movement.  States sometimes when she plants her foot and turns, the knee pops and feels very stiff.  She sometimes has some numbness over the knee and distal anterior thigh following this episode, resolves on its own after about 30 minutes; no current numbness.  ? ?HPI ? ?Past Medical History:  ?Diagnosis Date  ? Asthma   ? Hypertension   ? ? ?There are no problems to display for this patient. ? ? ?History reviewed. No pertinent surgical history. ? ?OB History   ?No obstetric history on file. ?  ? ? ? ?Home Medications   ? ?Prior to Admission medications   ?Medication Sig Start Date End Date Taking? Authorizing Provider  ?ketorolac (TORADOL) 10 MG tablet Take 1 tablet (10 mg total) by mouth every 6 (six) hours as needed. 08/23/21  Yes Hazel Sams, PA-C  ?albuterol (ACCUNEB) 1.25 MG/3ML nebulizer solution Take 3 mLs (1.25 mg total) by nebulization every 6 (six) hours as needed for wheezing. 07/17/21 08/16/21  Lynden Oxford Scales, PA-C  ?cetirizine (ZYRTEC ALLERGY) 10 MG tablet Take 1 tablet (10 mg total) by mouth at bedtime. 07/17/21 10/15/21  Lynden Oxford Scales, PA-C  ?EPINEPHrine 0.3 mg/0.3 mL IJ SOAJ injection Inject 0.3 mLs (0.3 mg total) into the muscle as needed for anaphylaxis. 12/26/19   Fatima Blank, MD  ?guaifenesin (HUMIBID E) 400 MG TABS tablet Take 1 tablet 3 times daily as needed for chest congestion and cough 07/17/21   Lynden Oxford Scales, PA-C  ?ibuprofen (ADVIL) 400 MG tablet Take 1 tablet (400 mg total) by mouth every 8 (eight) hours as needed for up to 30 doses. 07/17/21   Lynden Oxford Scales, PA-C  ?ipratropium (ATROVENT) 0.06 % nasal spray Place 2 sprays into both nostrils 4 (four) times daily. As needed for nasal congestion, runny nose 07/17/21   Lynden Oxford Scales, PA-C  ?ondansetron (ZOFRAN-ODT) 4 MG disintegrating tablet Take 1 tablet (4 mg total) by mouth every 8 (eight) hours as needed for nausea or vomiting. 08/05/21   Truddie Hidden, MD  ?oxyCODONE-acetaminophen (PERCOCET/ROXICET) 5-325 MG tablet Take 1 tablet by mouth every 6 (six) hours as needed for severe pain. 08/05/21   Truddie Hidden, MD  ?promethazine-dextromethorphan (PROMETHAZINE-DM) 6.25-15 MG/5ML syrup Take 5 mLs by mouth 4 (four) times daily as needed for cough. 07/17/21   Lynden Oxford Scales, PA-C  ? ? ?Family History ?No family history on file. ? ?Social History ?Social History  ? ?Tobacco Use  ? Smoking status: Every Day  ?  Types: Cigarettes  ? Smokeless tobacco: Never  ? ? ? ?Allergies   ?Banana, Pineapple, and Metronidazole ? ? ?Review of Systems ?Review of Systems  ?Musculoskeletal:   ?  R knee pain today   ?All other systems reviewed and are negative. ? ? ?Physical Exam ?Triage Vital Signs ?ED Triage Vitals [08/23/21 1445]  ?Enc Vitals Group  ?   BP 123/88  ?   Pulse Rate 85  ?   Resp 18  ?   Temp 98.5 ?F (36.9 ?C)  ?   Temp Source Oral  ?   SpO2 97 %  ?   Weight   ?   Height   ?   Head Circumference   ?   Peak Flow   ?   Pain Score 9  ?   Pain Loc   ?   Pain Edu?   ?   Excl. in [place]?   ? ?No data found. ? ?Updated Vital Signs ?BP 123/88 (BP Location: Right Arm)   Pulse 85   Temp 98.5 ?F (36.9 ?C) (Oral)   Resp 18   SpO2 97%  ? ?Visual Acuity ?Right Eye Distance:   ?Left Eye Distance:    ?Bilateral Distance:   ? ?Right Eye Near:   ?Left Eye Near:    ?Bilateral Near:    ? ?Physical Exam ?Vitals reviewed.  ?Constitutional:   ?   General: She is not in acute distress. ?   Appearance: Normal appearance. She is not ill-appearing.  ?HENT:  ?   Head: Normocephalic and atraumatic.  ?Pulmonary:  ?   Effort: Pulmonary effort is normal.  ?Musculoskeletal:  ?   Comments: R knee- TTP patella worse over the inferior aspect. ROM limited due to pain. No joint laxity. Negative valgus and varus stress test. No calf tenderness.   ?Neurological:  ?   General: No focal deficit present.  ?   Mental Status: She is alert and oriented to person, place, and time.  ?Psychiatric:     ?   Mood and Affect: Mood normal.     ?   Behavior: Behavior normal.     ?   Thought Content: Thought content normal.     ?   Judgment: Judgment normal.  ? ? ? ?UC Treatments / Results  ?Labs ?(all labs ordered are listed, but only abnormal results are displayed) ?Labs Reviewed - No data to display ? ?EKG ? ? ?Radiology ?DG Knee Complete 4 Views Right ? ?Result Date: 08/23/2021 ?CLINICAL DATA:  Pain and swelling of the patella after injury. EXAM: RIGHT KNEE - COMPLETE 4+ VIEW COMPARISON:  None. FINDINGS: No evidence of fracture, dislocation, or joint effusion. No evidence of arthropathy or other focal bone abnormality. Soft tissues are unremarkable. IMPRESSION: Negative. Electronically Signed   By: Ronney Asters M.D.   On: 08/23/2021 15:25   ? ?Procedures ?Procedures (including critical care time) ? ?Medications Ordered in UC ?Medications - No data to display ? ?Initial Impression / Assessment and Plan / UC Course  ?I have reviewed the triage vital signs and the nursing notes. ? ?Pertinent labs & imaging results that were available during my care of the patient were reviewed by me and considered in my medical decision making (see chart for details). ? ?  ? ?This patient is a very pleasant 26 y.o. year old female presenting with R knee injury.  Neurovascularly intact. States she is not pregnant or breastfeeding. ? ?Xray R knee negative.  ? ?Knee brace provided. RICE. Toradol for pain.  F/u with ortho if symptoms persist, information provided.  ? ?ED return precautions discussed. Patient verbalizes understanding and agreement.  ? ? ?Final Clinical Impressions(s) / UC  Diagnoses  ? ?Final diagnoses:  ?Contusion of right patella, initial encounter  ? ? ? ?Discharge Instructions   ? ?  ?-Toradol (Ketorolac), one pill up to every 6 hours for pain. Make sure to take this with food. Avoid other NSAIDs like ibuprofen, alleve, naproxen while taking this medication.  ?-You can also take Tylenol 1000mg  up to 3x daily ?-Rest, ice, tylenol/ibuprofen ?-Knee brace while standing and walking while pain persists ?-If symptoms persist in 5-7 days, follow-up with an orthopedist. I recommend [facility] at [address], [place], [place] [address]. You can schedule an appointment by calling [phone]) or online ([email or web address]), but they also have a walk-in clinic M-F 8a-8p and Sat 10a-3p. ? ? ? ? ? ?ED Prescriptions   ? ? Medication Sig Dispense Auth. Provider  ? ketorolac (TORADOL) 10 MG tablet Take 1 tablet (10 mg total) by mouth every 6 (six) hours as needed. 20 tablet Hazel Sams, PA-C  ? ?  ? ?PDMP not reviewed this encounter. ?  ?Hazel Sams, PA-C ?08/23/21 1613 ? ?

## 2021-08-23 NOTE — ED Triage Notes (Signed)
Pt reports that she slit her right knee in elementary school and had to heal on her on. 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 C/o pain  ?

## 2021-08-26 ENCOUNTER — Other Ambulatory Visit: Payer: Self-pay

## 2021-08-26 ENCOUNTER — Encounter (HOSPITAL_COMMUNITY): Payer: Self-pay | Admitting: General Surgery

## 2021-08-26 NOTE — Progress Notes (Signed)
Two VISITORS ARE ALLOWED TO COME WITH YOU AND STAY IN THE WAITING ROOM ONLY DURING PRE OP AND PROCEDURE DAY OF SURGERY.  ? ?PCP - None  ?Cardiologist - n/a ? ?Chest x-ray - 07/06/21 (2V) ?EKG - 08/05/21 ?Stress Test - n/a ?ECHO - n/a ?Cardiac Cath - n/a ? ?ICD Pacemaker/Loop - n/a ? ?Sleep Study -  n/a ?CPAP - none ? ?STOP now taking any Aspirin (unless otherwise instructed by your surgeon), Aleve, Naproxen, Ibuprofen, Motrin, Advil, Goody's, BC's, all herbal medications, fish oil, and all vitamins.  ? ?Coronavirus Screening ?Covid test n/a Ambulatory Surgery  ?Do you have any of the following symptoms:  ?Cough Yes-Asthma related ?Fever (>100.60F)  yes/no: No ?Runny nose yes/no: No ?Sore throat yes/no: No ?Difficulty breathing/shortness of breath  yes/no: No ? ?Have you traveled in the last 14 days and where? yes/no: No ? ?Patient verbalized understanding of instructions that were given via phone. ?

## 2021-08-30 ENCOUNTER — Ambulatory Visit (HOSPITAL_BASED_OUTPATIENT_CLINIC_OR_DEPARTMENT_OTHER): Payer: 59 | Admitting: Certified Registered Nurse Anesthetist

## 2021-08-30 ENCOUNTER — Other Ambulatory Visit: Payer: Self-pay

## 2021-08-30 ENCOUNTER — Ambulatory Visit (HOSPITAL_COMMUNITY)
Admission: RE | Admit: 2021-08-30 | Discharge: 2021-08-30 | Disposition: A | Discharge status: Home / Self Care | Payer: 59 | Attending: General Surgery | Admitting: General Surgery

## 2021-08-30 ENCOUNTER — Encounter (HOSPITAL_COMMUNITY): Payer: Self-pay | Admitting: General Surgery

## 2021-08-30 ENCOUNTER — Ambulatory Visit: Payer: Self-pay | Admitting: General Surgery

## 2021-08-30 ENCOUNTER — Encounter (HOSPITAL_COMMUNITY)
Admission: RE | Disposition: A | Discharge status: Home / Self Care | Payer: Self-pay | Source: Home / Self Care | Attending: General Surgery

## 2021-08-30 ENCOUNTER — Ambulatory Visit (HOSPITAL_COMMUNITY): Payer: 59 | Admitting: Certified Registered Nurse Anesthetist

## 2021-08-30 DIAGNOSIS — K8020 Calculus of gallbladder without cholecystitis without obstruction: Secondary | ICD-10-CM | POA: Insufficient documentation

## 2021-08-30 DIAGNOSIS — K8050 Calculus of bile duct without cholangitis or cholecystitis without obstruction: Secondary | ICD-10-CM | POA: Diagnosis not present

## 2021-08-30 HISTORY — PX: CHOLECYSTECTOMY: SHX55

## 2021-08-30 HISTORY — DX: Headache, unspecified: R51.9

## 2021-08-30 HISTORY — DX: Anxiety disorder, unspecified: F41.9

## 2021-08-30 HISTORY — DX: Unspecified osteoarthritis, unspecified site: M19.90

## 2021-08-30 HISTORY — DX: Depression, unspecified: F32.A

## 2021-08-30 LAB — POCT PREGNANCY, URINE: Preg Test, Ur: NEGATIVE

## 2021-08-30 SURGERY — LAPAROSCOPIC CHOLECYSTECTOMY
Anesthesia: General | Site: Abdomen

## 2021-08-30 MED ORDER — PROPOFOL 10 MG/ML IV BOLUS
INTRAVENOUS | Status: AC
  Filled 2021-08-30: qty 20

## 2021-08-30 MED ORDER — MIDAZOLAM HCL 5 MG/5ML IJ SOLN
INTRAMUSCULAR | Status: DC | PRN
  Administered 2021-08-30: 2 mg via INTRAVENOUS

## 2021-08-30 MED ORDER — OXYCODONE HCL 5 MG PO TABS: 5.0000 mg | ORAL_TABLET | Freq: Once | ORAL | Status: DC | PRN

## 2021-08-30 MED ORDER — ORAL CARE MOUTH RINSE: 15.0000 mL | Freq: Once | OROMUCOSAL | Status: AC

## 2021-08-30 MED ORDER — OXYCODONE HCL 5 MG/5ML PO SOLN: 5.0000 mg | Freq: Once | ORAL | Status: DC | PRN

## 2021-08-30 MED ORDER — DEXAMETHASONE SODIUM PHOSPHATE 4 MG/ML IJ SOLN
INTRAMUSCULAR | Status: DC | PRN
  Administered 2021-08-30: 4 mg via INTRAVENOUS

## 2021-08-30 MED ORDER — KETOROLAC TROMETHAMINE 30 MG/ML IJ SOLN
INTRAMUSCULAR | Status: DC | PRN
  Administered 2021-08-30: 30 mg via INTRAVENOUS

## 2021-08-30 MED ORDER — BUPIVACAINE HCL 0.25 % IJ SOLN
INTRAMUSCULAR | Status: DC | PRN
  Administered 2021-08-30: 10 mL

## 2021-08-30 MED ORDER — SODIUM CHLORIDE 0.9 % IR SOLN
Status: DC | PRN
  Administered 2021-08-30: 1000 mL

## 2021-08-30 MED ORDER — PROPOFOL 10 MG/ML IV BOLUS
INTRAVENOUS | Status: DC | PRN
  Administered 2021-08-30: 200 mg via INTRAVENOUS

## 2021-08-30 MED ORDER — CEFAZOLIN SODIUM-DEXTROSE 2-4 GM/100ML-% IV SOLN
2.0000 g | INTRAVENOUS | Status: AC
  Administered 2021-08-30: 2 g via INTRAVENOUS

## 2021-08-30 MED ORDER — ROCURONIUM BROMIDE 100 MG/10ML IV SOLN
INTRAVENOUS | Status: DC | PRN
  Administered 2021-08-30: 50 mg via INTRAVENOUS

## 2021-08-30 MED ORDER — BUPIVACAINE-EPINEPHRINE (PF) 0.25% -1:200000 IJ SOLN
INTRAMUSCULAR | Status: AC
  Filled 2021-08-30: qty 30

## 2021-08-30 MED ORDER — ONDANSETRON HCL 4 MG/2ML IJ SOLN
4.0000 mg | Freq: Four times a day (QID) | INTRAMUSCULAR | Status: AC | PRN
  Administered 2021-08-30: 4 mg via INTRAVENOUS

## 2021-08-30 MED ORDER — ONDANSETRON HCL 4 MG/2ML IJ SOLN
INTRAMUSCULAR | Status: AC
  Filled 2021-08-30: qty 2

## 2021-08-30 MED ORDER — FENTANYL CITRATE (PF) 100 MCG/2ML IJ SOLN
INTRAMUSCULAR | Status: AC
  Administered 2021-08-30: 50 ug via INTRAVENOUS
  Filled 2021-08-30: qty 2

## 2021-08-30 MED ORDER — TRAMADOL HCL 50 MG PO TABS: 50.0000 mg | ORAL_TABLET | Freq: Four times a day (QID) | ORAL | 0 refills | Status: AC | PRN

## 2021-08-30 MED ORDER — CHLORHEXIDINE GLUCONATE 0.12 % MT SOLN
OROMUCOSAL | Status: AC
  Administered 2021-08-30: 15 mL via OROMUCOSAL
  Filled 2021-08-30: qty 15

## 2021-08-30 MED ORDER — ONDANSETRON HCL 4 MG/2ML IJ SOLN
INTRAMUSCULAR | Status: DC | PRN
Start: 2021-08-30 — End: 2021-08-30
  Administered 2021-08-30: 4 mg via INTRAVENOUS

## 2021-08-30 MED ORDER — FENTANYL CITRATE (PF) 100 MCG/2ML IJ SOLN
INTRAMUSCULAR | Status: DC | PRN
  Administered 2021-08-30: 100 ug via INTRAVENOUS
  Administered 2021-08-30 (×3): 50 ug via INTRAVENOUS

## 2021-08-30 MED ORDER — LIDOCAINE HCL (CARDIAC) PF 100 MG/5ML IV SOSY
PREFILLED_SYRINGE | INTRAVENOUS | Status: DC | PRN
  Administered 2021-08-30: 60 mg via INTRAVENOUS

## 2021-08-30 MED ORDER — CHLORHEXIDINE GLUCONATE CLOTH 2 % EX PADS: 6.0000 | MEDICATED_PAD | Freq: Once | CUTANEOUS | Status: DC

## 2021-08-30 MED ORDER — FENTANYL CITRATE (PF) 100 MCG/2ML IJ SOLN
25.0000 ug | INTRAMUSCULAR | Status: DC | PRN
  Administered 2021-08-30: 50 ug via INTRAVENOUS

## 2021-08-30 MED ORDER — 0.9 % SODIUM CHLORIDE (POUR BTL) OPTIME
TOPICAL | Status: DC | PRN
  Administered 2021-08-30: 1000 mL

## 2021-08-30 MED ORDER — LIDOCAINE 2% (20 MG/ML) 5 ML SYRINGE
INTRAMUSCULAR | Status: AC
  Filled 2021-08-30: qty 5

## 2021-08-30 MED ORDER — ROCURONIUM BROMIDE 10 MG/ML (PF) SYRINGE
PREFILLED_SYRINGE | INTRAVENOUS | Status: AC
  Filled 2021-08-30: qty 10

## 2021-08-30 MED ORDER — DEXAMETHASONE SODIUM PHOSPHATE 10 MG/ML IJ SOLN
INTRAMUSCULAR | Status: AC
  Filled 2021-08-30: qty 1

## 2021-08-30 MED ORDER — ENSURE PRE-SURGERY PO LIQD: 296.0000 mL | Freq: Once | ORAL | Status: DC

## 2021-08-30 MED ORDER — ACETAMINOPHEN 500 MG PO TABS
ORAL_TABLET | ORAL | Status: AC
  Administered 2021-08-30: 1000 mg via ORAL
  Filled 2021-08-30: qty 2

## 2021-08-30 MED ORDER — CHLORHEXIDINE GLUCONATE 0.12 % MT SOLN: 15.0000 mL | Freq: Once | OROMUCOSAL | Status: AC

## 2021-08-30 MED ORDER — LACTATED RINGERS IV SOLN: INTRAVENOUS | Status: DC

## 2021-08-30 MED ORDER — MIDAZOLAM HCL 2 MG/2ML IJ SOLN
INTRAMUSCULAR | Status: AC
  Filled 2021-08-30: qty 2

## 2021-08-30 MED ORDER — SUGAMMADEX SODIUM 200 MG/2ML IV SOLN
INTRAVENOUS | Status: DC | PRN
  Administered 2021-08-30: 200 mg via INTRAVENOUS

## 2021-08-30 MED ORDER — ACETAMINOPHEN 500 MG PO TABS: 1000.0000 mg | ORAL_TABLET | ORAL | Status: AC

## 2021-08-30 MED ORDER — FENTANYL CITRATE (PF) 250 MCG/5ML IJ SOLN
INTRAMUSCULAR | Status: AC
  Filled 2021-08-30: qty 5

## 2021-08-30 MED ORDER — LACTATED RINGERS IV SOLN
INTRAVENOUS | Status: DC | PRN
Start: 2021-08-30 — End: 2021-08-30

## 2021-08-30 MED ORDER — CEFAZOLIN SODIUM-DEXTROSE 2-4 GM/100ML-% IV SOLN
INTRAVENOUS | Status: AC
  Filled 2021-08-30: qty 100

## 2021-08-30 SURGICAL SUPPLY — 42 items
BAG COUNTER SPONGE SURGICOUNT (BAG) ×2 IMPLANT
CANISTER SUCT 3000ML PPV (MISCELLANEOUS) ×2 IMPLANT
CHLORAPREP W/TINT 26 (MISCELLANEOUS) ×2 IMPLANT
CLIP LIGATING HEMO O LOK GREEN (MISCELLANEOUS) ×2 IMPLANT
COVER SURGICAL LIGHT HANDLE (MISCELLANEOUS) ×2 IMPLANT
COVER TRANSDUCER ULTRASND (DRAPES) ×2 IMPLANT
DERMABOND ADVANCED (GAUZE/BANDAGES/DRESSINGS) ×1
DERMABOND ADVANCED .7 DNX12 (GAUZE/BANDAGES/DRESSINGS) ×1 IMPLANT
ELECT REM PT RETURN 9FT ADLT (ELECTROSURGICAL) ×2
ELECTRODE REM PT RTRN 9FT ADLT (ELECTROSURGICAL) ×1 IMPLANT
ENDOLOOP SUT PDS II  0 18 (SUTURE) ×1
ENDOLOOP SUT PDS II 0 18 (SUTURE) IMPLANT
GLOVE SURG SYN 7.5  E (GLOVE) ×1
GLOVE SURG SYN 7.5 E (GLOVE) ×1 IMPLANT
GLOVE SURG SYN 7.5 PF PI (GLOVE) ×1 IMPLANT
GOWN STRL REUS W/ TWL LRG LVL3 (GOWN DISPOSABLE) ×2 IMPLANT
GOWN STRL REUS W/ TWL XL LVL3 (GOWN DISPOSABLE) ×1 IMPLANT
GOWN STRL REUS W/TWL LRG LVL3 (GOWN DISPOSABLE) ×2
GOWN STRL REUS W/TWL XL LVL3 (GOWN DISPOSABLE) ×1
GRASPER SUT TROCAR 14GX15 (MISCELLANEOUS) ×2 IMPLANT
KIT BASIN OR (CUSTOM PROCEDURE TRAY) ×2 IMPLANT
KIT TURNOVER KIT B (KITS) ×2 IMPLANT
NDL INSUFFLATION 14GA 120MM (NEEDLE) ×1 IMPLANT
NEEDLE INSUFFLATION 14GA 120MM (NEEDLE) ×2 IMPLANT
NS IRRIG 1000ML POUR BTL (IV SOLUTION) ×2 IMPLANT
PAD ARMBOARD 7.5X6 YLW CONV (MISCELLANEOUS) ×2 IMPLANT
POUCH LAPAROSCOPIC INSTRUMENT (MISCELLANEOUS) ×2 IMPLANT
POUCH RETRIEVAL ECOSAC 10 (ENDOMECHANICALS) IMPLANT
POUCH RETRIEVAL ECOSAC 10MM (ENDOMECHANICALS)
SCISSORS LAP 5X35 DISP (ENDOMECHANICALS) ×2 IMPLANT
SET IRRIG TUBING LAPAROSCOPIC (IRRIGATION / IRRIGATOR) ×2 IMPLANT
SET TUBE SMOKE EVAC HIGH FLOW (TUBING) ×2 IMPLANT
SLEEVE ENDOPATH XCEL 5M (ENDOMECHANICALS) ×2 IMPLANT
SPECIMEN JAR SMALL (MISCELLANEOUS) ×2 IMPLANT
SUT MNCRL AB 4-0 PS2 18 (SUTURE) ×2 IMPLANT
TOWEL GREEN STERILE (TOWEL DISPOSABLE) ×2 IMPLANT
TOWEL GREEN STERILE FF (TOWEL DISPOSABLE) ×2 IMPLANT
TRAY LAPAROSCOPIC MC (CUSTOM PROCEDURE TRAY) ×2 IMPLANT
TROCAR XCEL NON-BLD 11X100MML (ENDOMECHANICALS) ×2 IMPLANT
TROCAR XCEL NON-BLD 5MMX100MML (ENDOMECHANICALS) ×2 IMPLANT
WARMER LAPAROSCOPE (MISCELLANEOUS) ×2 IMPLANT
WATER STERILE IRR 1000ML POUR (IV SOLUTION) ×2 IMPLANT

## 2021-08-30 NOTE — H&P (Signed)
Chief Complaint: New Patient (Gallstones ) ? ? ?History of Present Illness: ?Mary Molina is a 26 y.o. female who is seen today as an office consultation at the request of Dr. System for evaluation of New Patient (Gallstones ) ?Marland Kitchen  ?Patient is a 26 year old female, with a known history of gallstones. ?Patient states that she previously had been diagnosed with gallstones however cannot afford surgery at that time. She states that since then she has continued with abdominal pain, in the epigastrium that radiates to her back. She states that for a stress for approximate 3 months she had nausea vomiting diarrhea associate with the pain. She states that it is gotten somewhat better. ? ?Patient does state that she stays away from any red meat and dairy to help prevent that pain that she recognizes is due to her gallbladder. ? ?Patient did undergo ultrasound which I reviewed personally. Ultrasound does reveal multiple gallstones. Patient had normal LFTs. ? ?She had no previous abdominal surgery. ? ?Review of Systems: ?A complete review of systems was obtained from the patient. I have reviewed this information and discussed as appropriate with the patient. See HPI as well for other ROS. ? ?Review of Systems  ?Constitutional: Negative for fever.  ?HENT: Negative for congestion.  ?Eyes: Negative for blurred vision.  ?Respiratory: Negative for cough, shortness of breath and wheezing.  ?Cardiovascular: Negative for chest pain and palpitations.  ?Gastrointestinal: Positive for abdominal pain, diarrhea, nausea and vomiting. Negative for heartburn.  ?Genitourinary: Negative for dysuria.  ?Musculoskeletal: Negative for myalgias.  ?Skin: Negative for rash.  ?Neurological: Negative for dizziness and headaches.  ?Psychiatric/Behavioral: Negative for depression and suicidal ideas.  ?All other systems reviewed and are negative. ? ? ?Medical History: ?Past Medical History:  ?Diagnosis Date  ? Anemia  ? Anxiety  ? Arthritis  ? Asthma,  unspecified asthma severity, unspecified whether complicated, unspecified whether persistent  ? Sleep apnea  ? ?There is no problem list on file for this patient. ? ?Past Surgical History:  ?Procedure Laterality Date  ? wisdom teeth surgery 2016  ? ? ?No Known Allergies ? ?Current Outpatient Medications on File Prior to Visit  ?Medication Sig Dispense Refill  ? cetirizine (ZYRTEC) 10 MG tablet Take 10 mg by mouth at bedtime  ? ipratropium (ATROVENT) 0.06 % nasal spray USE 2 SPRAYS IN EACH NOSTRIL 4 TIMES A DAY AS NEEDED FOR NASAL CONGESTION, RUNNY NOSE  ? ondansetron (ZOFRAN-ODT) 4 MG disintegrating tablet DISSOLVE 1 TABLET ON TONGUE EVERY 8 HOURS AS NEEDED FOR NAUSEA AND VOMITING  ? oxyCODONE-acetaminophen (PERCOCET) 5-325 mg tablet TAKE 1 TABLET BY MOUTH EVERY 6 (SIX) HOURS AS NEEDED FOR SEVERE PAIN.  ? ?No current facility-administered medications on file prior to visit.  ? ?Family History  ?Problem Relation Age of Onset  ? High blood pressure (Hypertension) Father  ? Diabetes Father  ? Hyperlipidemia (Elevated cholesterol) Brother  ? ? ?Social History  ? ?Tobacco Use  ?Smoking Status Every Day  ? Packs/day: 0.50  ? Types: Cigarettes  ?Smokeless Tobacco Current  ? ? ?Social History  ? ?Socioeconomic History  ? Marital status: Single  ?Tobacco Use  ? Smoking status: Every Day  ?Packs/day: 0.50  ?Types: Cigarettes  ? Smokeless tobacco: Current  ?Substance and Sexual Activity  ? Alcohol use: Never  ? Drug use: Yes  ?Comment: social drinker  ? ?Objective:  ? ?Vitals:  ?08/22/21 1511  ?BP: 130/80  ?Pulse: 102  ?Temp: 37.1 ?C (98.7 ?F)  ?SpO2: 99%  ?Weight: 96.6 kg (213  lb)  ?Height: 165.1 cm (5\' 5" )  ? ?Body mass index is 35.45 kg/m?. ? ?Physical Exam ?Constitutional:  ?Appearance: Normal appearance.  ?HENT:  ?Head: Normocephalic and atraumatic.  ?Mouth/Throat:  ?Mouth: Mucous membranes are moist.  ?Pharynx: Oropharynx is clear.  ?Eyes:  ?General: No scleral icterus. ?Pupils: Pupils are equal, round, and reactive to  light.  ?Cardiovascular:  ?Rate and Rhythm: Normal rate and regular rhythm.  ?Pulses: Normal pulses.  ?Heart sounds: No murmur heard. ?No friction rub. No gallop.  ?Pulmonary:  ?Effort: Pulmonary effort is normal. No respiratory distress.  ?Breath sounds: Normal breath sounds. No stridor.  ?Abdominal:  ?General: Abdomen is flat.  ?Musculoskeletal:  ?General: No swelling.  ?Skin: ?General: Skin is warm.  ?Neurological:  ?General: No focal deficit present.  ?Mental Status: She is alert and oriented to person, place, and time. Mental status is at baseline.  ?Psychiatric:  ?Mood and Affect: Mood normal.  ?Thought Content: Thought content normal.  ?Judgment: Judgment normal.  ? ? ? ?Assessment and Plan:  ?Diagnoses and all orders for this visit: ? ?Symptomatic cholelithiasis ? ? ?Mary Molina is a 26 y.o. female  ? ?1. We will proceed to the OR for a lap cholecystectomy. ?2. All risks and benefits were discussed with the patient to generally include: infection, bleeding, possible need for post op ERCP, damage to the bile ducts, and bile leak. Alternatives were offered and described. All questions were answered and the patient voiced understanding of the procedure and wishes to proceed at this point with a laparoscopic cholecystectomy ? ?No follow-ups on file. ? ?Ralene Ok, MD, FACS ?[facility], [place] ?General & Minimally Invasive Surgery ? ?

## 2021-08-30 NOTE — Op Note (Signed)
Laparoscopic Cholecystectomy Procedure Note ? ?Indications: This patient presents with biliary cholic and will undergo laparoscopic cholecystectomy. ? ?Pre-operative Diagnosis: Biliary cholic ? ?Post-operative Diagnosis: Biliary cholic ? ?Surgeon: Axel Filler MD ? ?Assistants: Basilio Cairo MD ? ?Anesthesia: General endotracheal anesthesia ? ?ASA Class: 2 ? ?Procedure Details  ?The patient was seen again in the Holding Room. The risks, benefits, complications, treatment options, and expected outcomes were discussed with the patient. The patient agreed with the proposed plan, giving informed consent. The patient was taken to Operating Room, identified as Collie Siad and the procedure verified as Laparoscopic Cholecystectomy. A Time Out was held and the above information confirmed. ? ?Prior to the induction of general anesthesia, antibiotic prophylaxis was administered. General endotracheal anesthesia was then administered and tolerated well. After the induction, the abdomen was prepped with Chloraprep and draped in sterile fashion. The patient was positioned in the supine position. ? ?Local anesthetic agent was injected into the right upper quadrant, a 36mm incision was made, and Veress needle used to establish pneumoperitoneum. A 15mm port was then placed blindly through this incision, and the camera was introduced into the abdomen. No injuries were noted upon entry into the abdomen. Local was used prior to placing additional 12, 5 and 66mm ports in the infraumbilical, subxiphoid, and right flank respectively. ? ?We positioned the patient in reverse Trendelenburg, tilted slightly to the patient's left.  The gallbladder was identified, the fundus grasped and retracted cephalad. Adhesions were lysed bluntly and with the electrocautery where indicated, taking care not to injure any adjacent organs or viscus. The infundibulum was grasped and retracted laterally, exposing the peritoneum overlying the triangle of Calot.  This was then divided and exposed in a blunt fashion. The cystic duct and candidate cystic artery were clearly identified and bluntly dissected circumferentially. The cystic artery and duct were doubly clipped then cut. Given the size of the cystic duct, a 2-0 PDS endoloop was placed around the cystic duct. ? ?The gallbladder was dissected from the liver bed in retrograde fashion with the electrocautery. The gallbladder was removed. The liver bed was irrigated and inspected. Hemostasis was achieved with the electrocautery. Copious irrigation was utilized and was repeatedly aspirated until clear. A PMI device and 2-0 vicryl were used to close the 66mm fascial defect.  ? ?We again inspected the right upper quadrant for hemostasis.  Pneumoperitoneum was released as we removed the trocars.  4-0 Monocryl was used to close the skin.   Skin glue was then applied. The patient was then extubated and brought to the recovery room in stable condition. Instrument, sponge, and needle counts were correct at closure and at the conclusion of the case.  ? ?Findings: ?Cholelithiasis ? ?Estimated Blood Loss: Minimal ?        ?Specimens: Gallbladder     ?      ?Complications: None; patient tolerated the procedure well. ?        ?Disposition: PACU - hemodynamically stable. ?        ?Condition: stable ? ? ?

## 2021-08-30 NOTE — Anesthesia Procedure Notes (Signed)
Procedure Name: Intubation ?Date/Time: 08/30/2021 10:06 AM ?Performed by: Oletta Lamas, CRNA ?Pre-anesthesia Checklist: Patient identified, Emergency Drugs available, Suction available and Patient being monitored ?Patient Re-evaluated:Patient Re-evaluated prior to induction ?Oxygen Delivery Method: Circle System Utilized ?Preoxygenation: Pre-oxygenation with 100% oxygen ?Induction Type: IV induction ?Ventilation: Mask ventilation without difficulty ?Laryngoscope Size: Sabra Heck and 2 ?Grade View: Grade I ?Tube type: Oral ?Number of attempts: 1 ?Airway Equipment and Method: Stylet ?Placement Confirmation: ETT inserted through vocal cords under direct vision, positive ETCO2 and breath sounds checked- equal and bilateral ?Secured at: 22 cm ?Tube secured with: Tape ?Dental Injury: Teeth and Oropharynx as per pre-operative assessment  ? ? ? ? ?

## 2021-08-30 NOTE — Discharge Instructions (Signed)
[facility] ______[facility] LAPAROSCOPIC SURGERY: POST OP INSTRUCTIONS Always review your discharge instruction sheet given to you by the facility where your surgery was performed. IF YOU HAVE DISABILITY OR FAMILY LEAVE FORMS, YOU MUST BRING THEM TO THE OFFICE FOR PROCESSING.   DO NOT GIVE THEM TO YOUR DOCTOR.  A prescription for pain medication may be given to you upon discharge.  Take your pain medication as prescribed, if needed.  If narcotic pain medicine is not needed, then you may take acetaminophen (Tylenol) or ibuprofen (Advil) as needed. Take your usually prescribed medications unless otherwise directed. If you need a refill on your pain medication, please contact your pharmacy.  They will contact our office to request authorization. Prescriptions will not be filled after 5pm or on week-ends. You should follow a light diet the first few days after arrival home, such as soup and crackers, etc.  Be sure to include lots of fluids daily. Most patients will experience some swelling and bruising in the area of the incisions.  Ice packs will help.  Swelling and bruising can take several days to resolve.  It is common to experience some constipation if taking pain medication after surgery.  Increasing fluid intake and taking a stool softener (such as Colace) will usually help or prevent this problem from occurring.  A mild laxative (Milk of Magnesia or Miralax) should be taken according to package instructions if there are no bowel movements after 48 hours. Unless discharge instructions indicate otherwise, you may remove your bandages 24-48 hours after surgery, and you may shower at that time.  You may have steri-strips (small skin tapes) in place directly over the incision.  These strips should be left on the skin for 7-10 days.  If your surgeon used skin glue on the incision, you may shower in 24 hours.  The glue will flake off over the next 2-3 weeks.  Any sutures or staples will be  removed at the office during your follow-up visit. ACTIVITIES:  You may resume regular (light) daily activities beginning the next day--such as daily self-care, walking, climbing stairs--gradually increasing activities as tolerated.  You may have sexual intercourse when it is comfortable.  Refrain from any heavy lifting or straining until approved by your doctor. You may drive when you are no longer taking prescription pain medication, you can comfortably wear a seatbelt, and you can safely maneuver your car and apply brakes. RETURN TO WORK:  __________________________________________________________ You should see your doctor in the office for a follow-up appointment approximately 2-3 weeks after your surgery.  Make sure that you call for this appointment within a day or two after you arrive home to insure a convenient appointment time. OTHER INSTRUCTIONS: __________________________________________________________________________________________________________________________ __________________________________________________________________________________________________________________________ WHEN TO CALL YOUR DOCTOR: Fever over 101.0 Inability to urinate Continued bleeding from incision. Increased pain, redness, or drainage from the incision. Increasing abdominal pain  The clinic staff is available to answer your questions during regular business hours.  Please don't hesitate to call and ask to speak to one of the nurses for clinical concerns.  If you have a medical emergency, go to the nearest emergency room or call 911.  A surgeon from [facility] is always on call at the hospital. [address], [address], [place], [place]  [address] ? [address] [address], [place],    [address] [phone] ? [phone] ? FAX [phone] Web site: [email or web address]  

## 2021-08-30 NOTE — Transfer of Care (Addendum)
Immediate Anesthesia Transfer of Care Note ? ?Patient: Mary Molina ? ?Procedure(s) Performed: LAPAROSCOPIC CHOLECYSTECTOMY (Abdomen) ? ?Patient Location: PACU ? ?Anesthesia Type:General ? ?Level of Consciousness: awake, alert , oriented and patient cooperative ? ?Airway & Oxygen Therapy: Patient Spontanous Breathing ? ?Post-op Assessment: Report given to RN and Post -op Vital signs reviewed and stable ? ?Post vital signs: Reviewed and stable ? ?Last Vitals:  ?Vitals Value Taken Time  ?BP 132/69 08/30/21 1121  ?Temp    ?Pulse 94 08/30/21 1125  ?Resp 22 08/30/21 1125  ?SpO2 91 % 08/30/21 1125  ?Vitals shown include unvalidated device data. ? ?Last Pain:  ?Vitals:  ? 08/30/21 0826  ?TempSrc:   ?PainSc: 0-No pain  ?   ? ?  ? ?Complications: No notable events documented. ?

## 2021-08-30 NOTE — Anesthesia Preprocedure Evaluation (Signed)
Anesthesia Evaluation  ?Patient identified by MRN, date of birth, ID band ?Patient awake ? ? ? ?Reviewed: ?Allergy & Precautions, H&P , NPO status , Patient's Chart, lab work & pertinent test results ? ?Airway ?Mallampati: II ? ? ?Neck ROM: full ? ? ? Dental ?  ?Pulmonary ?asthma , Current Smoker and Patient abstained from smoking.,  ?  ?breath sounds clear to auscultation ? ? ? ? ? ? Cardiovascular ?hypertension,  ?Rhythm:regular Rate:Normal ? ? ?  ?Neuro/Psych ? Headaches, PSYCHIATRIC DISORDERS Anxiety Depression   ? GI/Hepatic ?gallstones ?  ?Endo/Other  ? ? Renal/GU ?  ? ?  ?Musculoskeletal ? ?(+) Arthritis ,  ? Abdominal ?  ?Peds ? Hematology ?  ?Anesthesia Other Findings ? ? Reproductive/Obstetrics ? ?  ? ? ? ? ? ? ? ? ? ? ? ? ? ?  ?  ? ? ? ? ? ? ? ? ?Anesthesia Physical ?Anesthesia Plan ? ?ASA: 2 ? ?Anesthesia Plan: General  ? ?Post-op Pain Management:   ? ?Induction: Intravenous ? ?PONV Risk Score and Plan: 2 and Ondansetron, Dexamethasone, Midazolam and Treatment may vary due to age or medical condition ? ?Airway Management Planned: Oral ETT ? ?Additional Equipment:  ? ?Intra-op Plan:  ? ?Post-operative Plan: Extubation in OR ? ?Informed Consent: I have reviewed the patients History and Physical, chart, labs and discussed the procedure including the risks, benefits and alternatives for the proposed anesthesia with the patient or authorized representative who has indicated his/her understanding and acceptance.  ? ? ? ?Dental advisory given ? ?Plan Discussed with: CRNA, Anesthesiologist and Surgeon ? ?Anesthesia Plan Comments:   ? ? ? ? ? ? ?Anesthesia Quick Evaluation ? ?

## 2021-08-31 ENCOUNTER — Encounter (HOSPITAL_COMMUNITY): Payer: Self-pay | Admitting: General Surgery

## 2021-08-31 LAB — SURGICAL PATHOLOGY

## 2021-08-31 NOTE — Anesthesia Postprocedure Evaluation (Signed)
Anesthesia Post Note ? ?Patient: Florene Brill ? ?Procedure(s) Performed: LAPAROSCOPIC CHOLECYSTECTOMY (Abdomen) ? ?  ? ?Patient location during evaluation: PACU ?Anesthesia Type: General ?Level of consciousness: awake and alert ?Pain management: pain level controlled ?Vital Signs Assessment: post-procedure vital signs reviewed and stable ?Respiratory status: spontaneous breathing, nonlabored ventilation, respiratory function stable and patient connected to nasal cannula oxygen ?Cardiovascular status: blood pressure returned to baseline and stable ?Postop Assessment: no apparent nausea or vomiting ?Anesthetic complications: no ? ? ?No notable events documented. ? ?Last Vitals:  ?Vitals:  ? 08/30/21 1205 08/30/21 1220  ?BP: 140/86 140/90  ?Pulse: 71 83  ?Resp: 15 14  ?Temp:  37 ?C  ?SpO2: 98% 100%  ?  ?Last Pain:  ?Vitals:  ? 08/30/21 1150  ?TempSrc:   ?PainSc: 10-Worst pain ever  ? ? ?  ?  ?  ?  ?  ?  ? ?Alayne Estrella S ? ? ? ? ?

## 2022-01-25 ENCOUNTER — Emergency Department (HOSPITAL_COMMUNITY): Admission: EM | Admit: 2022-01-25 | Discharge: 2022-01-25 | Discharge status: Against Medical Advice | Payer: 59

## 2022-01-25 NOTE — ED Notes (Signed)
Pt stated that she no longer wanted to wait  

## 2022-06-15 IMAGING — US US ABDOMEN LIMITED
1 series · 14 of 25 positions shown · non-contrast
Comparison: None.

CLINICAL DATA: Epigastric pain

EXAM:
ULTRASOUND ABDOMEN LIMITED RIGHT UPPER QUADRANT

[Series 1: us abdomen limited ruq (liver/gb) · 14 of 63 slices shown]
[im 1/63]
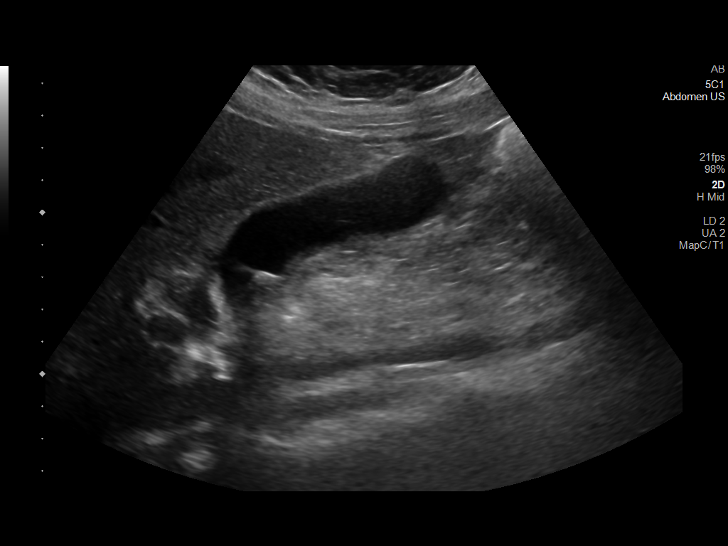
[im 6/63]
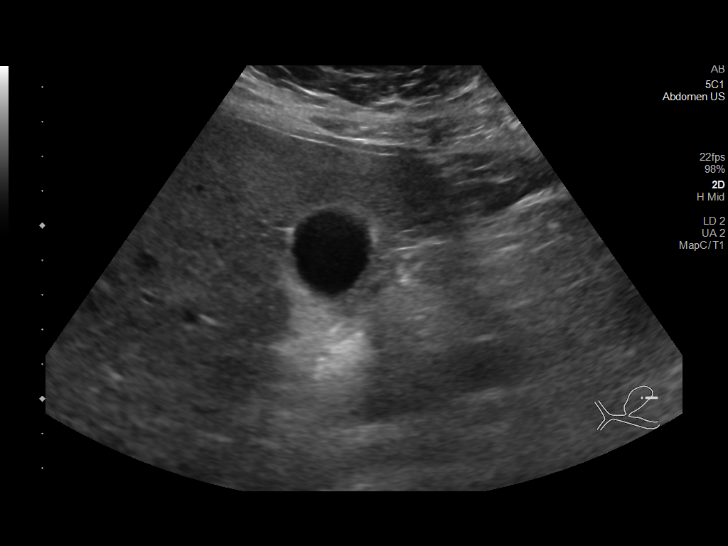
[im 11/63]
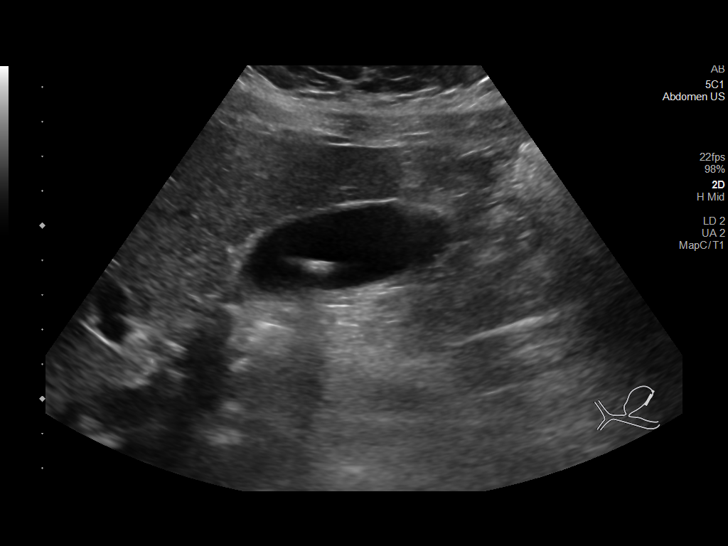
[im 16/63]
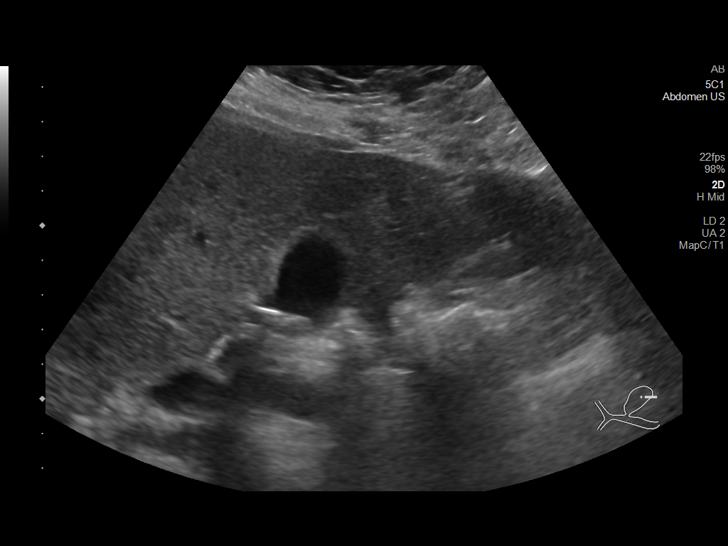
[im 21/63]
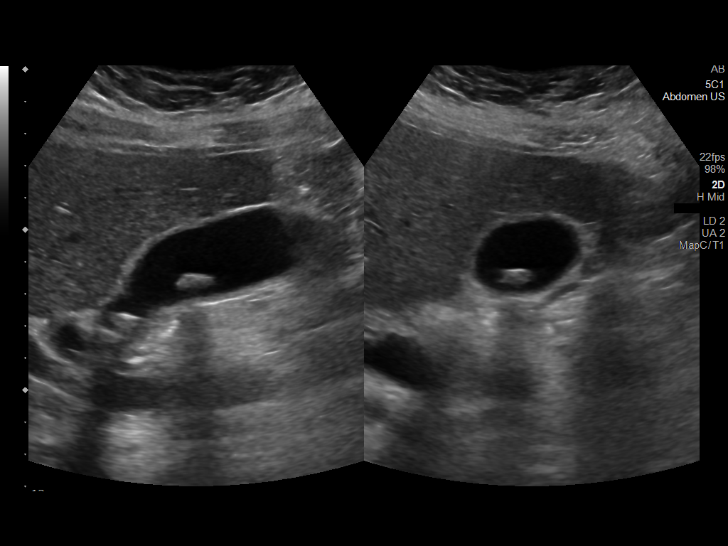
[im 24/63]
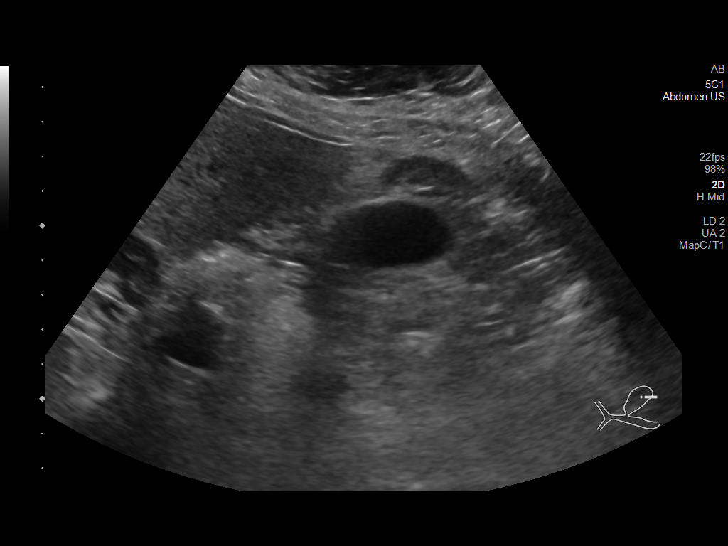
[im 29/63]
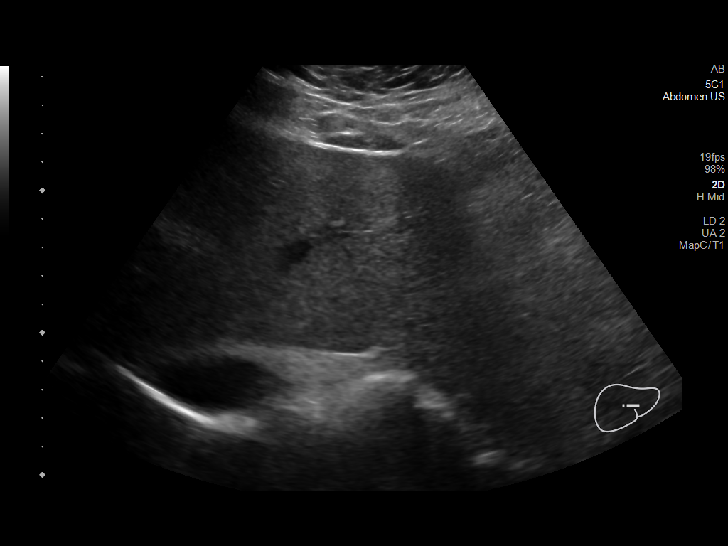
[im 34/63]
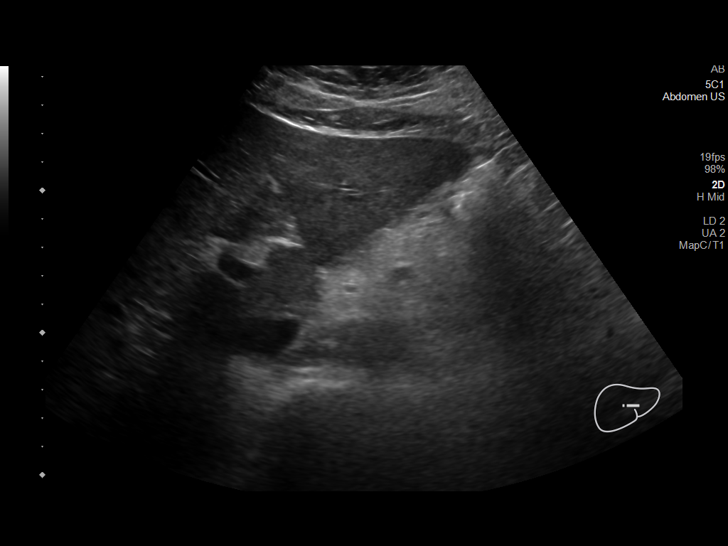
[im 39/63]
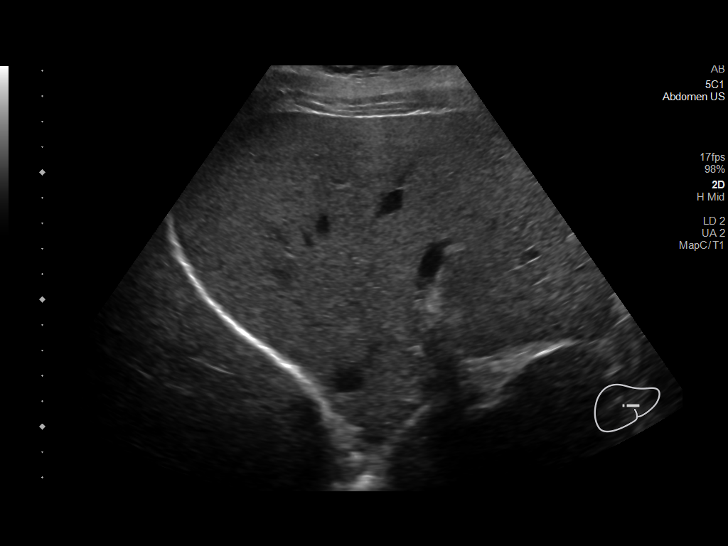
[im 42/63]
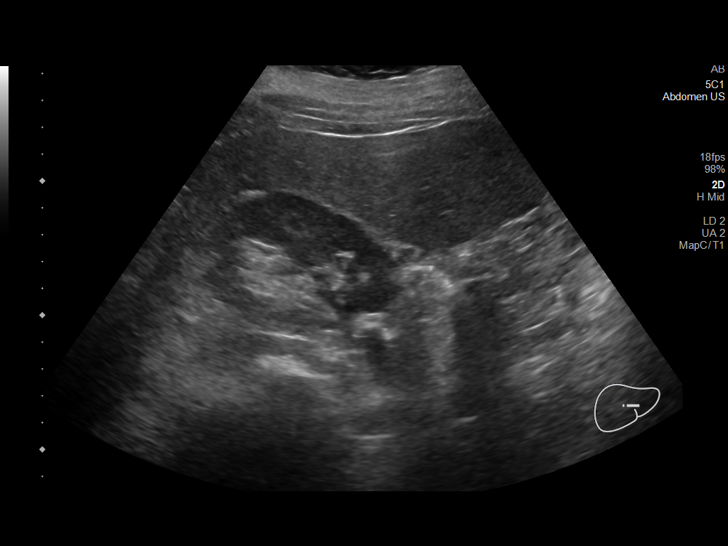
[im 47/63]
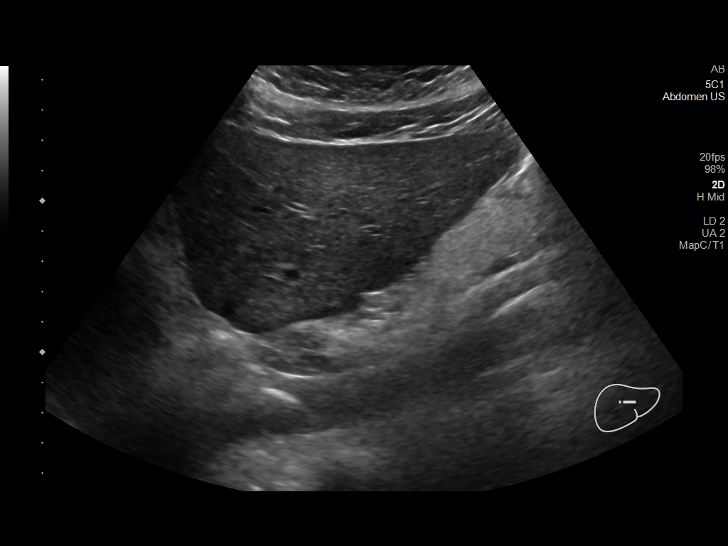
[im 52/63]
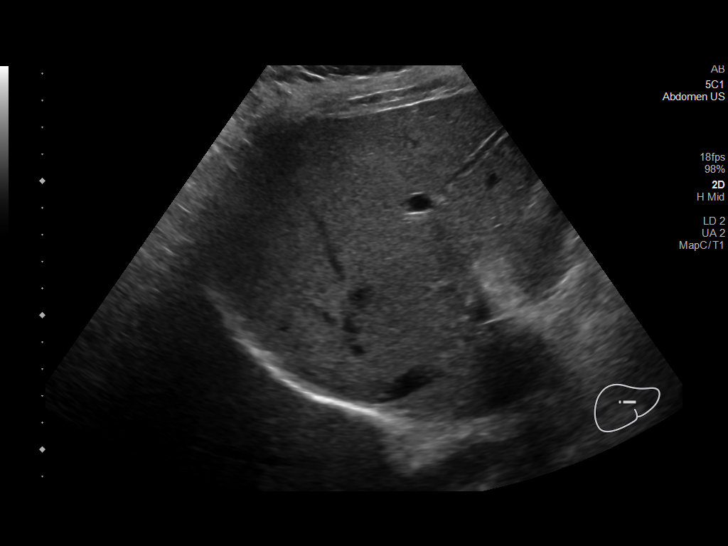
[im 57/63]
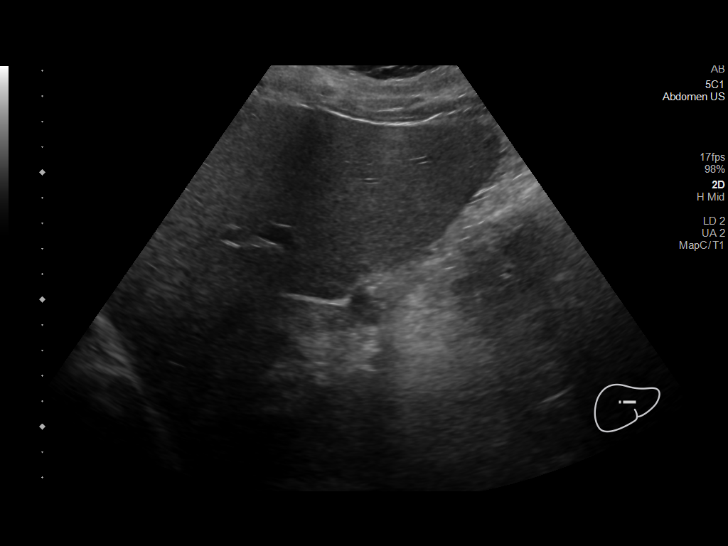
[im 63/63]
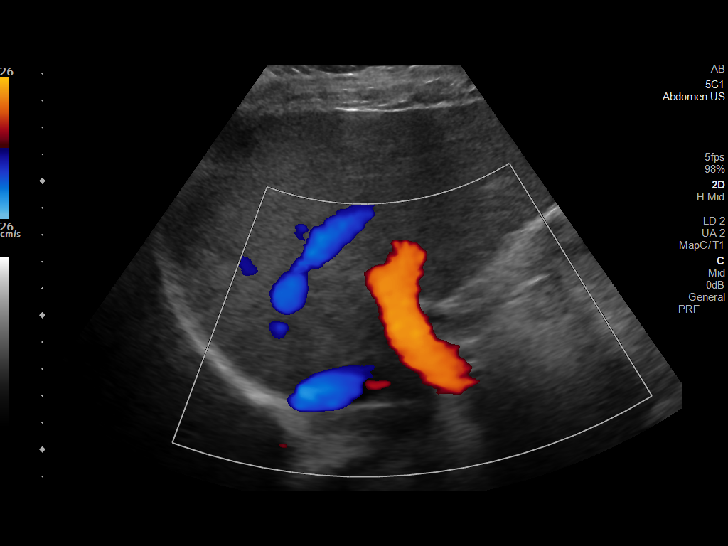

[14 of 25 positions shown; findings below may reference images not displayed]

FINDINGS: Gallbladder:

Shadowing gallstones. No gallbladder wall thickening although there
is reported focal tenderness. No pericholecystic inflammation.

Common bile duct:

Diameter: 4 mm.  Where visualized, no filling defect.

Liver:

No focal lesion identified. Within normal limits in parenchymal
echogenicity. Portal vein is patent on color Doppler imaging with
normal direction of blood flow towards the liver.
IMPRESSION: Cholelithiasis. There is gallbladder tenderness but no wall
thickening or pericholecystic inflammation typical of acute
cholecystitis.

## 2022-09-14 IMAGING — DX DG CHEST 2V
2 series · 2 of 2 positions shown · non-contrast
Comparison: 06/24/2020

CLINICAL DATA: Shortness of breath on exertion

EXAM:
CHEST - 2 VIEW

[chest pa]
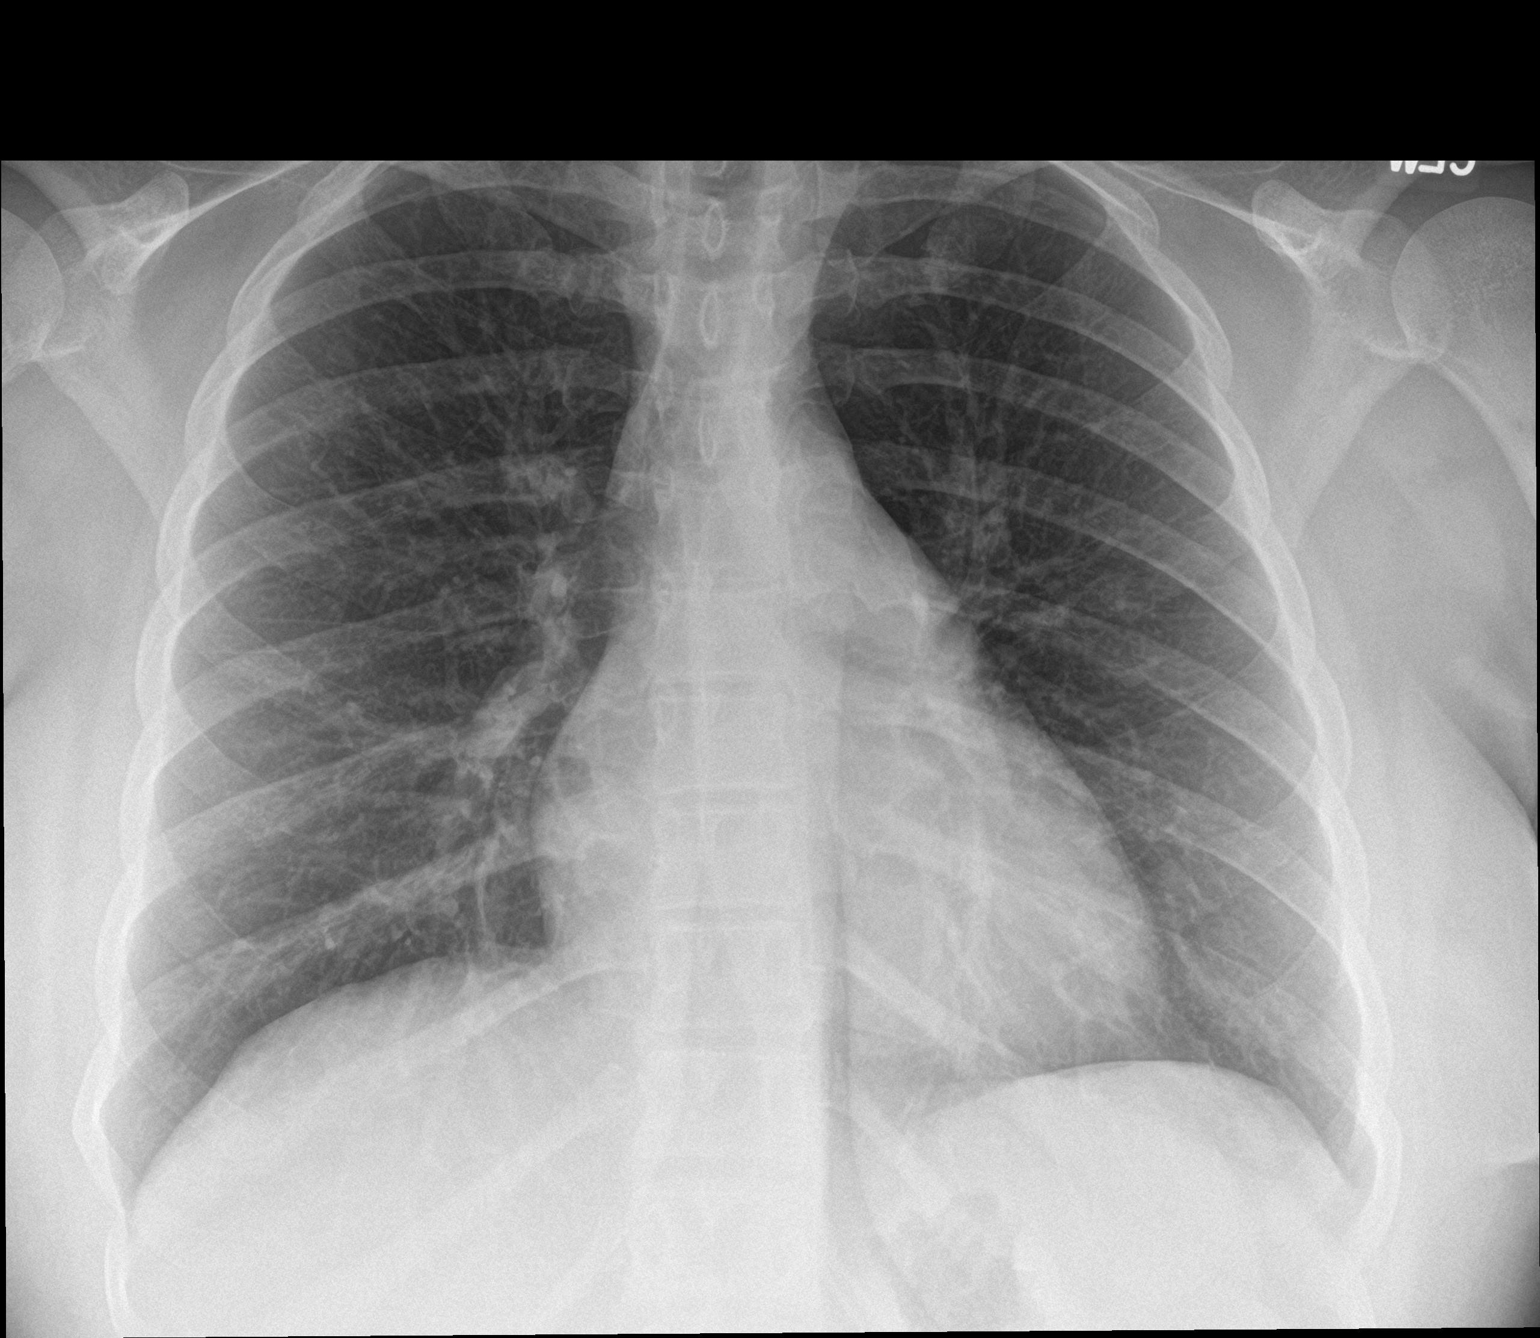

[chest lat]
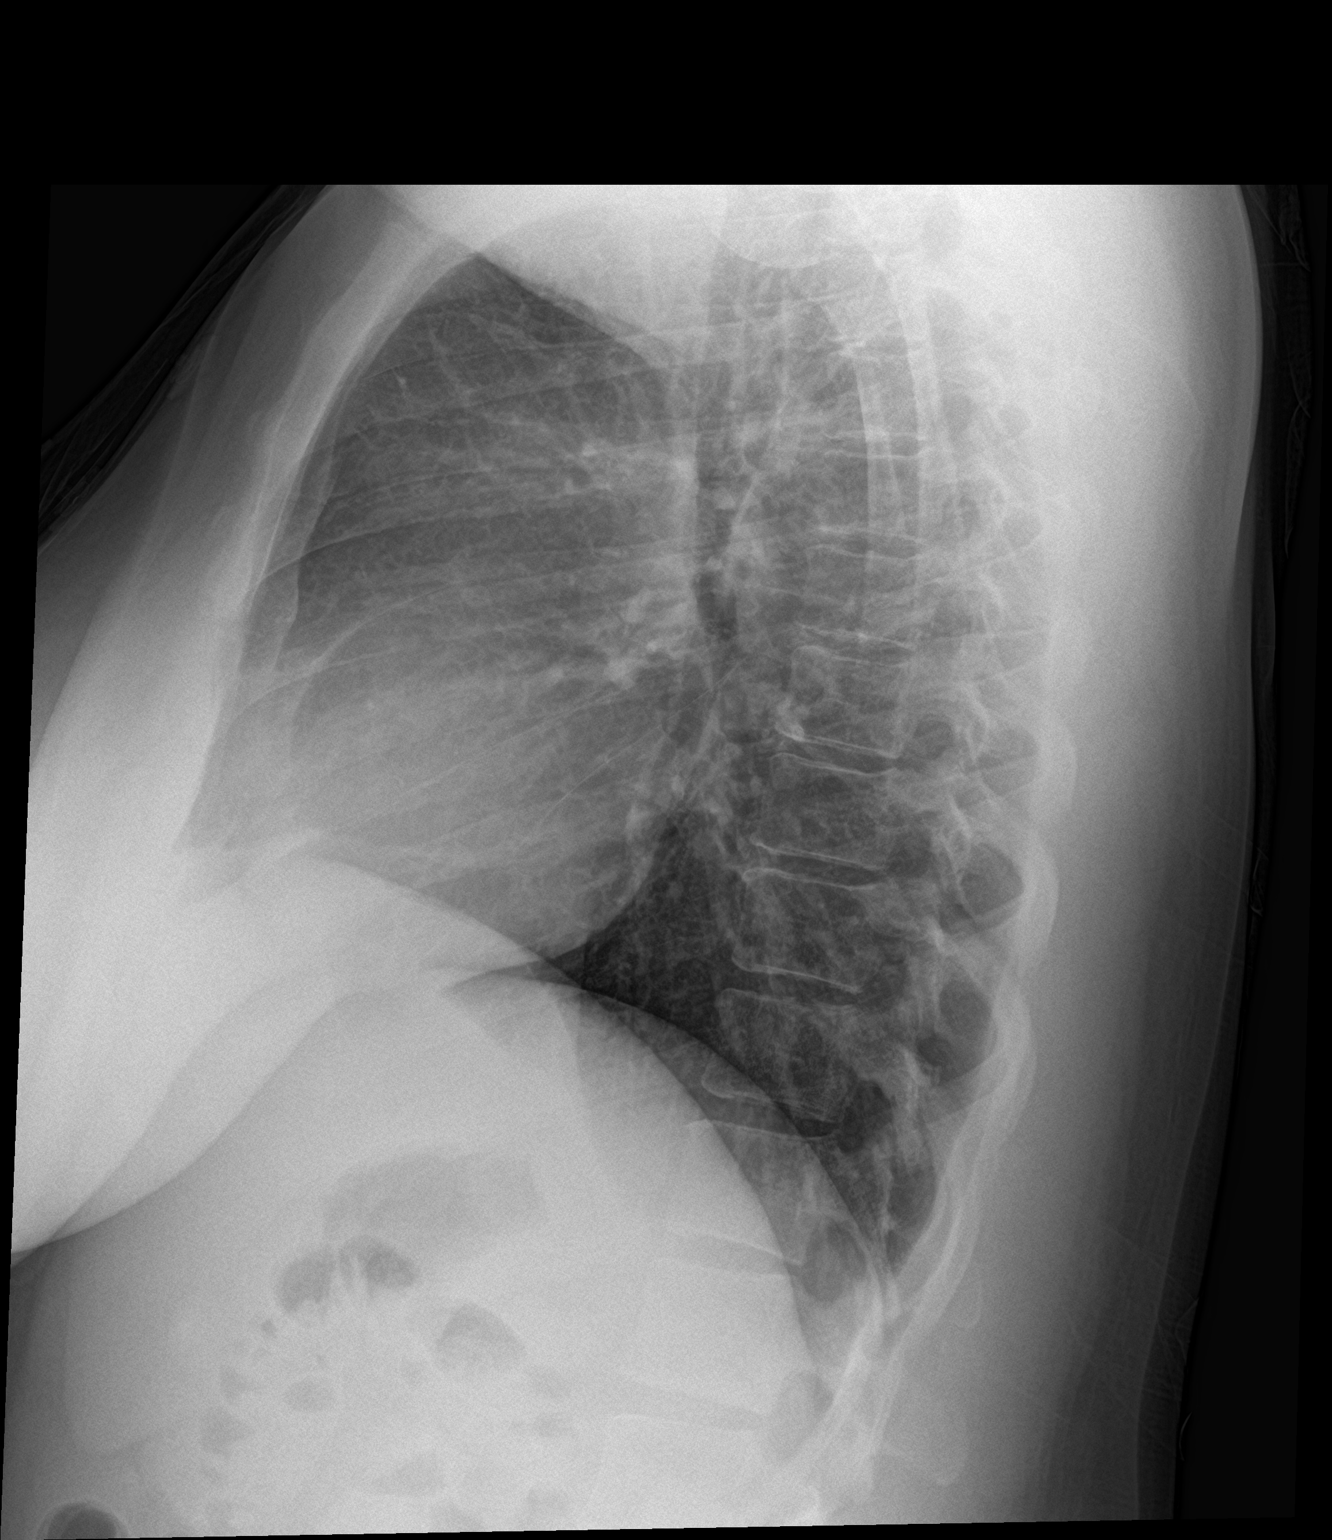

[2 of 2 positions shown; findings below may reference images not displayed]

FINDINGS: The heart size and mediastinal contours are within normal limits.
Both lungs are clear. The visualized skeletal structures are
unremarkable.
IMPRESSION: Negative.
